# Patient Record
Sex: Female | Born: 1995 | Race: White | Hispanic: No | Smoking: Never smoker
Health system: Southern US, Community
[De-identification: ages and names within clinical notes are randomized; demographics above are authoritative.]

## PROBLEM LIST (undated history)

## (undated) DIAGNOSIS — F419 Anxiety disorder, unspecified: Secondary | ICD-10-CM

## (undated) DIAGNOSIS — D649 Anemia, unspecified: Secondary | ICD-10-CM

## (undated) DIAGNOSIS — J45909 Unspecified asthma, uncomplicated: Secondary | ICD-10-CM

## (undated) DIAGNOSIS — F909 Attention-deficit hyperactivity disorder, unspecified type: Secondary | ICD-10-CM

## (undated) DIAGNOSIS — F32A Depression, unspecified: Secondary | ICD-10-CM

## (undated) HISTORY — DX: Unspecified asthma, uncomplicated: J45.909

## (undated) HISTORY — DX: Anemia, unspecified: D64.9

## (undated) HISTORY — PX: WISDOM TOOTH EXTRACTION: SHX21

## (undated) HISTORY — DX: Attention-deficit hyperactivity disorder, unspecified type: F90.9

## (undated) HISTORY — DX: Depression, unspecified: F32.A

## (undated) HISTORY — DX: Anxiety disorder, unspecified: F41.9

## (undated) HISTORY — PX: TONSILLECTOMY: SUR1361

---
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
## 2019-12-26 LAB — HM PAP SMEAR: HM Pap smear: NEGATIVE

## 2020-01-13 ENCOUNTER — Encounter (HOSPITAL_COMMUNITY): Payer: Self-pay

## 2020-01-13 ENCOUNTER — Ambulatory Visit (HOSPITAL_COMMUNITY)
Admission: EM | Admit: 2020-01-13 | Discharge: 2020-01-13 | Disposition: A | Discharge status: Home / Self Care | Payer: Medicaid Other | Attending: Urgent Care | Admitting: Urgent Care

## 2020-01-13 DIAGNOSIS — N3000 Acute cystitis without hematuria: Secondary | ICD-10-CM

## 2020-01-13 DIAGNOSIS — Z3202 Encounter for pregnancy test, result negative: Secondary | ICD-10-CM

## 2020-01-13 DIAGNOSIS — R109 Unspecified abdominal pain: Secondary | ICD-10-CM

## 2020-01-13 DIAGNOSIS — R350 Frequency of micturition: Secondary | ICD-10-CM | POA: Diagnosis present

## 2020-01-13 DIAGNOSIS — R300 Dysuria: Secondary | ICD-10-CM | POA: Diagnosis present

## 2020-01-13 LAB — POC URINE PREG, ED: Preg Test, Ur: NEGATIVE

## 2020-01-13 MED ORDER — SULFAMETHOXAZOLE-TRIMETHOPRIM 800-160 MG PO TABS: 1.0000 | ORAL_TABLET | Freq: Two times a day (BID) | ORAL | 0 refills | Status: DC

## 2020-01-13 NOTE — ED Triage Notes (Signed)
Pt c/o 7/10 sharp left flak pain, urinary frequency, urinary urgency, and nauseax2 days.

## 2020-01-13 NOTE — Discharge Instructions (Signed)
Make sure you hydrate very well with plain water and a quantity of 64 ounces of water a day.  Please limit drinks that are considered urinary irritants such as soda, sweet tea, coffee, energy drinks, alcohol.  These can worsen your UTI symptoms and also be the source of them.  I will let you know about your urine culture results through MyChart to see if we need to change your antibiotics based off of those results. ° °

## 2020-01-13 NOTE — ED Provider Notes (Signed)
[facility]   MRN: [id number] DOB: 30-Oct-1995  Subjective:   Margaret Reed is a 24 y.o. female presenting for 1 week history of persistent dysuria, urinary frequency, urinary urgency, left flank pain.  Patient was seen at a different clinic, diagnosed with an UTI and prescribed with the patient thinks is ciprofloxacin.  She finished the course and does not know her results but still has the symptoms.  Admits that she does not drink water at all.  She primarily hydrates with sodas only.  Denies fevers, nausea, vomiting, abdominal or pelvic pain, hematuria.  No current facility-administered medications for this encounter.  Current Outpatient Medications:    busPIRone (BUSPAR) 15 MG tablet, Take 15 mg by mouth 2 (two) times daily., Disp: , Rfl:    cloNIDine (CATAPRES) 0.2 MG tablet, Take 0.2 mg by mouth at bedtime., Disp: , Rfl:    escitalopram (LEXAPRO) 20 MG tablet, Take 20 mg by mouth daily., Disp: , Rfl:    hydrOXYzine (VISTARIL) 25 MG capsule, Take 25 mg by mouth 3 (three) times daily as needed for anxiety., Disp: , Rfl:    norethindrone-ethinyl estradiol (LOESTRIN) 1-20 MG-MCG tablet, Take 1 tablet by mouth daily., Disp: , Rfl:    No Known Allergies  History reviewed. No pertinent past medical history.   Past Surgical History:  Procedure Laterality Date   TONSILLECTOMY     WISDOM TOOTH EXTRACTION      No family history on file.  Social History   Tobacco Use   Smoking status: Never Smoker   Smokeless tobacco: Never Used  Substance Use Topics   Alcohol use: Never   Drug use: Never    ROS   Objective:   Vitals: BP 126/82    Pulse 93    Temp 99.4 F (37.4 C) (Oral)    Resp 16    Ht 5\' 4"  (1.626 m)    Wt 277 lb (125.6 kg)    SpO2 97%    BMI 47.55 kg/m   Physical Exam Constitutional:      General: She is not in acute distress.    Appearance: Normal appearance. She is well-developed. She is not ill-appearing, toxic-appearing or diaphoretic.  HENT:      Head: Normocephalic and atraumatic.     Nose: Nose normal.     Mouth/Throat:     Mouth: Mucous membranes are moist.     Pharynx: Oropharynx is clear.  Eyes:     General: No scleral icterus.       Right eye: No discharge.        Left eye: No discharge.     Extraocular Movements: Extraocular movements intact.     Conjunctiva/sclera: Conjunctivae normal.     Pupils: Pupils are equal, round, and reactive to light.  Cardiovascular:     Rate and Rhythm: Normal rate.  Pulmonary:     Effort: Pulmonary effort is normal.  Abdominal:     General: Bowel sounds are normal. There is no distension.     Palpations: Abdomen is soft. There is no mass.     Tenderness: There is no abdominal tenderness. There is no right CVA tenderness, left CVA tenderness, guarding or rebound.  Skin:    General: Skin is warm and dry.  Neurological:     General: No focal deficit present.     Mental Status: She is alert and oriented to person, place, and time.  Psychiatric:        Mood and Affect: Mood normal.  Behavior: Behavior normal.        Thought Content: Thought content normal.        Judgment: Judgment normal.     Results for orders placed or performed during the hospital encounter of 01/13/20 (from the past 24 hour(s))  POC urine pregnancy     Status: None   Collection Time: 01/13/20  2:12 PM  Result Value Ref Range   Preg Test, Ur NEGATIVE NEGATIVE   Urinalysis did not crossover, was positive for nitrite, large leukocytes, specific gravity of 1.000.  Assessment and Plan :   PDMP not reviewed this encounter.  1. Acute cystitis without hematuria   2. Dysuria   3. Urinary frequency   4. Left flank pain     Start Bactrim, urine culture pending.  Advised patient to sign up for my chart so we could communicate results.  Recommended cutting out all sodas, starting to drink just plain water. Counseled patient on potential for adverse effects with medications prescribed/recommended today, ER  and return-to-clinic precautions discussed, patient verbalized understanding.    Wallis Bamberg, PA-C 01/13/20 1422

## 2020-01-13 NOTE — ED Notes (Signed)
Urinalysis testing exceeded the limitations of the [employer] capabilities. Notified provider. Urine culture ordered 

## 2020-01-15 LAB — URINE CULTURE: Culture: >=100000 — AB

## 2020-01-16 ENCOUNTER — Encounter (HOSPITAL_COMMUNITY): Payer: Self-pay

## 2020-01-16 ENCOUNTER — Other Ambulatory Visit: Payer: Self-pay

## 2020-01-16 DIAGNOSIS — N3000 Acute cystitis without hematuria: Secondary | ICD-10-CM | POA: Diagnosis not present

## 2020-01-16 DIAGNOSIS — N390 Urinary tract infection, site not specified: Secondary | ICD-10-CM | POA: Diagnosis present

## 2020-01-16 DIAGNOSIS — B9689 Other specified bacterial agents as the cause of diseases classified elsewhere: Secondary | ICD-10-CM | POA: Diagnosis not present

## 2020-01-16 LAB — CBC
HCT: 40.5 % (ref 36.0–46.0)
Hemoglobin: 12.8 g/dL (ref 12.0–15.0)
MCH: 26.3 pg (ref 26.0–34.0)
MCHC: 31.6 g/dL (ref 30.0–36.0)
MCV: 83.2 fL (ref 80.0–100.0)
Platelets: 386 10*3/uL (ref 150–400)
RBC: 4.87 MIL/uL (ref 3.87–5.11)
RDW: 14.8 % (ref 11.5–15.5)
WBC: 10.1 10*3/uL (ref 4.0–10.5)
nRBC: 0 % (ref 0.0–0.2)

## 2020-01-16 NOTE — ED Triage Notes (Signed)
Patient arrived stating since Friday she has been having sharp left side pain. Diagnosed with UTI from urgent care. Reports urinary symptoms have resolved but still having the flank pain and told to come to the ED for a scan.

## 2020-01-17 ENCOUNTER — Emergency Department (HOSPITAL_COMMUNITY)
Admission: EM | Admit: 2020-01-17 | Discharge: 2020-01-17 | Disposition: A | Discharge status: Home / Self Care | Payer: Medicaid Other | Attending: Emergency Medicine | Admitting: Emergency Medicine

## 2020-01-17 ENCOUNTER — Emergency Department (HOSPITAL_COMMUNITY): Payer: Medicaid Other

## 2020-01-17 ENCOUNTER — Encounter (HOSPITAL_COMMUNITY): Payer: Self-pay | Admitting: Emergency Medicine

## 2020-01-17 DIAGNOSIS — N3000 Acute cystitis without hematuria: Secondary | ICD-10-CM

## 2020-01-17 LAB — BASIC METABOLIC PANEL
Anion gap: 8 (ref 5–15)
BUN: 18 mg/dL (ref 6–20)
CO2: 26 mmol/L (ref 22–32)
Calcium: 9.6 mg/dL (ref 8.9–10.3)
Chloride: 105 mmol/L (ref 98–111)
Creatinine, Ser: 1.12 mg/dL — ABNORMAL HIGH (ref 0.44–1.00)
GFR calc Af Amer: >60 mL/min (ref >60)
GFR calc non Af Amer: >60 mL/min (ref >60)
Glucose, Bld: 103 mg/dL — ABNORMAL HIGH (ref 70–99)
Potassium: 5 mmol/L (ref 3.5–5.1)
Sodium: 139 mmol/L (ref 135–145)

## 2020-01-17 LAB — URINALYSIS, ROUTINE W REFLEX MICROSCOPIC
Bilirubin Urine: NEGATIVE
Glucose, UA: NEGATIVE mg/dL
Ketones, ur: NEGATIVE mg/dL
Leukocytes,Ua: NEGATIVE
Nitrite: NEGATIVE
Protein, ur: NEGATIVE mg/dL
Specific Gravity, Urine: 1.011 (ref 1.005–1.030)
pH: 5 (ref 5.0–8.0)

## 2020-01-17 LAB — I-STAT BETA HCG BLOOD, ED (MC, WL, AP ONLY): I-stat hCG, quantitative: <5 m[IU]/mL (ref <5)

## 2020-01-17 MED ORDER — IBUPROFEN 800 MG PO TABS
800.0000 mg | ORAL_TABLET | Freq: Once | ORAL | Status: AC
  Administered 2020-01-17: 800 mg via ORAL
  Filled 2020-01-17: qty 1

## 2020-01-17 MED ORDER — CEPHALEXIN 500 MG PO CAPS: ORAL_CAPSULE | ORAL | 0 refills | Status: DC

## 2020-01-17 NOTE — ED Provider Notes (Signed)
[facility]EMERGENCY DEPT Provider Note   CSN: [id number] Arrival date & time: 01/16/20  2315     History No chief complaint on file.   Margaret Reed is a 24 y.o. female.  The history is provided by the patient.  Flank Pain This is a new problem. The current episode started more than 1 week ago. The problem occurs constantly. The problem has not changed since onset.Pertinent negatives include no chest pain, no abdominal pain, no headaches and no shortness of breath. Nothing aggravates the symptoms. Nothing relieves the symptoms. Treatments tried: bactrim  The treatment provided no relief.  Patient is being treated for UTI by urgent care and diagnosed with UTI.  Still having L low back pain.  No f/c/r. No n/v/d.  No hematuria.  Was told by urgent care to be seen for CT scan.       History reviewed. No pertinent past medical history.  There are no problems to display for this patient.   Past Surgical History:  Procedure Laterality Date  . TONSILLECTOMY    . WISDOM TOOTH EXTRACTION       OB History   No obstetric history on file.     History reviewed. No pertinent family history.  Social History   Tobacco Use  . Smoking status: Never Smoker  . Smokeless tobacco: Never Used  Substance Use Topics  . Alcohol use: Never  . Drug use: Never    Home Medications Prior to Admission medications   Medication Sig Start Date End Date Taking? Authorizing Provider  acetaminophen (TYLENOL) 325 MG tablet Take 650 mg by mouth every 6 (six) hours as needed for mild pain, moderate pain, fever or headache.   Yes [provider]  norethindrone-ethinyl estradiol (LOESTRIN) 1-20 MG-MCG tablet Take 1 tablet by mouth daily. 12/26/19  Yes [provider]  sulfamethoxazole-trimethoprim (BACTRIM DS) 800-160 MG tablet Take 1 tablet by mouth 2 (two) times daily. 01/13/20  Yes Wallis Bamberg, PA-C    Allergies    Patient has no known allergies.  Review of  Systems   Review of Systems  Constitutional: Negative for unexpected weight change.  HENT: Negative for congestion.   Eyes: Negative for visual disturbance.  Respiratory: Negative for shortness of breath.   Cardiovascular: Negative for chest pain.  Gastrointestinal: Negative for abdominal pain.  Genitourinary: Positive for dysuria and flank pain.  Musculoskeletal: Negative for arthralgias.  Skin: Negative for color change.  Neurological: Negative for headaches.  Psychiatric/Behavioral: Negative for agitation.  All other systems reviewed and are negative.   Physical Exam Updated Vital Signs BP (!) 143/81 (BP Location: Left Arm)   Pulse 73   Temp 97.9 F (36.6 C) (Oral)   Resp 17   Ht 5\' 4"  (1.626 m)   Wt 125.2 kg   LMP 01/16/2020 Comment: negative beta HCG 01/16/20  SpO2 96%   BMI 47.38 kg/m   Physical Exam Vitals and nursing note reviewed.  Constitutional:      General: She is not in acute distress.    Appearance: Normal appearance.  HENT:     Head: Normocephalic and atraumatic.     Nose: Nose normal.  Eyes:     Conjunctiva/sclera: Conjunctivae normal.     Pupils: Pupils are equal, round, and reactive to light.  Cardiovascular:     Rate and Rhythm: Normal rate and regular rhythm.     Pulses: Normal pulses.     Heart sounds: Normal heart sounds.  Pulmonary:     Effort:  Pulmonary effort is normal.     Breath sounds: Normal breath sounds.  Abdominal:     General: Abdomen is flat. Bowel sounds are normal.     Palpations: Abdomen is soft.     Tenderness: There is no abdominal tenderness. There is no guarding or rebound.  Musculoskeletal:        General: Normal range of motion.     Cervical back: Normal range of motion and neck supple.  Skin:    General: Skin is warm and dry.     Capillary Refill: Capillary refill takes less than 2 seconds.  Neurological:     General: No focal deficit present.     Mental Status: She is alert and oriented to person, place, and time.   Psychiatric:        Mood and Affect: Mood normal.        Behavior: Behavior normal.     ED Results / Procedures / Treatments   Labs (all labs ordered are listed, but only abnormal results are displayed) Results for orders placed or performed during the hospital encounter of 01/17/20  CBC  Result Value Ref Range   WBC 10.1 4.0 - 10.5 K/uL   RBC 4.87 3.87 - 5.11 MIL/uL   Hemoglobin 12.8 12.0 - 15.0 g/dL   HCT 88.8 36 - 46 %   MCV 83.2 80.0 - 100.0 fL   MCH 26.3 26.0 - 34.0 pg   MCHC 31.6 30.0 - 36.0 g/dL   RDW 28.0 03.4 - 91.7 %   Platelets 386 150 - 400 K/uL   nRBC 0.0 0.0 - 0.2 %  Basic metabolic panel  Result Value Ref Range   Sodium 139 135 - 145 mmol/L   Potassium 5.0 3.5 - 5.1 mmol/L   Chloride 105 98 - 111 mmol/L   CO2 26 22 - 32 mmol/L   Glucose, Bld 103 (H) 70 - 99 mg/dL   BUN 18 6 - 20 mg/dL   Creatinine, Ser 9.15 (H) 0.44 - 1.00 mg/dL   Calcium 9.6 8.9 - 05.6 mg/dL   GFR calc non Af Amer >60 >60 mL/min   GFR calc Af Amer >60 >60 mL/min   Anion gap 8 5 - 15  I-Stat beta hCG blood, ED  Result Value Ref Range   I-stat hCG, quantitative <5.0 <5 mIU/mL   Comment 3           CT Renal Stone Study  Result Date: 01/17/2020 CLINICAL DATA:  Left flank pain for 5 days EXAM: CT ABDOMEN AND PELVIS WITHOUT CONTRAST TECHNIQUE: Multidetector CT imaging of the abdomen and pelvis was performed following the standard protocol without IV contrast. COMPARISON:  None. FINDINGS: Lower chest: Unremarkable Hepatobiliary: Unremarkable Pancreas: Unremarkable Spleen: Unremarkable Adrenals/Urinary Tract: Unremarkable Stomach/Bowel: Unremarkable.  Appendix normal. Vascular/Lymphatic: Unremarkable Reproductive: Unremarkable Other: No supplemental non-categorized findings. Musculoskeletal: Unremarkable IMPRESSION: A cause for the patient's left flank pain is not identified. Electronically Signed   By: Gaylyn Rong M.D.   On: 01/17/2020 06:02    Radiology CT Renal Stone Study  Result  Date: 01/17/2020 CLINICAL DATA:  Left flank pain for 5 days EXAM: CT ABDOMEN AND PELVIS WITHOUT CONTRAST TECHNIQUE: Multidetector CT imaging of the abdomen and pelvis was performed following the standard protocol without IV contrast. COMPARISON:  None. FINDINGS: Lower chest: Unremarkable Hepatobiliary: Unremarkable Pancreas: Unremarkable Spleen: Unremarkable Adrenals/Urinary Tract: Unremarkable Stomach/Bowel: Unremarkable.  Appendix normal. Vascular/Lymphatic: Unremarkable Reproductive: Unremarkable Other: No supplemental non-categorized findings. Musculoskeletal: Unremarkable IMPRESSION: A cause for the patient's left  flank pain is not identified. Electronically Signed   By: Gaylyn Rong M.D.   On: 01/17/2020 06:02    Procedures Procedures (including critical care time)  Medications Ordered in ED Medications  ibuprofen (ADVIL) tablet 800 mg (800 mg Oral Given 01/17/20 0458)    ED Course  I have reviewed the triage vital signs and the nursing notes.  Pertinent labs & imaging results that were available during my care of the patient were reviewed by me and considered in my medical decision making (see chart for details).   Will change antibiotics to keflex as culture reveals it is slightly better.  No stones.  Start ibuprofen.     Margaret Reed was evaluated in Emergency Department on 01/17/2020 for the symptoms described in the history of present illness. She was evaluated in the context of the global COVID-19 pandemic, which necessitated consideration that the patient might be at risk for infection with the SARS-CoV-2 virus that causes COVID-19. Institutional protocols and algorithms that pertain to the evaluation of patients at risk for COVID-19 are in a state of rapid change based on information released by regulatory bodies including the CDC and federal and state organizations. These policies and algorithms were followed during the patient's care in the ED.  Final Clinical Impression(s) / ED  Diagnoses  Return for intractable cough, coughing up blood,fevers >100.4 unrelieved by medication, shortness of breath, intractable vomiting, chest pain, shortness of breath, weakness,numbness, changes in speech, facial asymmetry,abdominal pain, passing out,Inability to tolerate liquids or food, cough, altered mental status or any concerns. No signs of systemic illness or infection. The patient is nontoxic-appearing on exam and vital signs are within normal limits.   I have reviewed the triage vital signs and the nursing notes. Pertinent labs &imaging results that were available during my care of the patient were reviewed by me and considered in my medical decision making (see chart for details).After history, exam, and medical workup I feel the patient has beenappropriately medically screened and is safe for discharge home. Pertinent diagnoses were discussed with the patient. Patient was given return precautions.    Malaijah Houchen, MD 01/17/20 [phone]

## 2020-07-20 DIAGNOSIS — F32A Depression, unspecified: Secondary | ICD-10-CM | POA: Insufficient documentation

## 2020-07-20 DIAGNOSIS — F419 Anxiety disorder, unspecified: Secondary | ICD-10-CM | POA: Insufficient documentation

## 2020-09-25 ENCOUNTER — Other Ambulatory Visit: Payer: Self-pay

## 2020-09-25 ENCOUNTER — Ambulatory Visit: Payer: BLUE CROSS/BLUE SHIELD | Admitting: Family

## 2020-10-16 ENCOUNTER — Ambulatory Visit: Payer: Self-pay | Admitting: Family Medicine

## 2020-10-22 DIAGNOSIS — J0110 Acute frontal sinusitis, unspecified: Secondary | ICD-10-CM | POA: Insufficient documentation

## 2020-11-14 ENCOUNTER — Encounter: Payer: Self-pay | Admitting: Family Medicine

## 2020-11-14 ENCOUNTER — Ambulatory Visit: Payer: BLUE CROSS/BLUE SHIELD | Admitting: Family Medicine

## 2020-11-14 ENCOUNTER — Other Ambulatory Visit: Payer: Self-pay

## 2020-11-14 VITALS — BP 120/78 | HR 72 | Ht 66.0 in | Wt 276.0 lb

## 2020-11-14 DIAGNOSIS — J4520 Mild intermittent asthma, uncomplicated: Secondary | ICD-10-CM | POA: Diagnosis not present

## 2020-11-14 DIAGNOSIS — R45851 Suicidal ideations: Secondary | ICD-10-CM

## 2020-11-14 DIAGNOSIS — F902 Attention-deficit hyperactivity disorder, combined type: Secondary | ICD-10-CM

## 2020-11-14 DIAGNOSIS — Z9151 Personal history of suicidal behavior: Secondary | ICD-10-CM

## 2020-11-14 DIAGNOSIS — F32A Depression, unspecified: Secondary | ICD-10-CM

## 2020-11-14 DIAGNOSIS — G4730 Sleep apnea, unspecified: Secondary | ICD-10-CM

## 2020-11-14 DIAGNOSIS — F419 Anxiety disorder, unspecified: Secondary | ICD-10-CM

## 2020-11-14 DIAGNOSIS — L309 Dermatitis, unspecified: Secondary | ICD-10-CM

## 2020-11-14 MED ORDER — TRIAMCINOLONE ACETONIDE 0.1 % EX CREA: 1.0000 "application " | TOPICAL_CREAM | Freq: Two times a day (BID) | CUTANEOUS | 0 refills | Status: DC

## 2020-11-14 MED ORDER — QVAR REDIHALER 40 MCG/ACT IN AERB: 1.0000 | INHALATION_SPRAY | Freq: Two times a day (BID) | RESPIRATORY_TRACT | 2 refills | Status: DC

## 2020-11-14 NOTE — Patient Instructions (Signed)
You can call to schedule your appointment with the psychiatrist/counselor. A few offices are listed below for you to call.    [facility]  Ask for a psychiatrist  [address]  (across from [facility])  [phone]      [facility] [address] [place], [place] [address] [phone]   [facility]  [address], [place], [place] [address]  Phone: [phone]   [facility] [address] [place], [place] [address]  Phone: [phone]  You can call to schedule your appointment with the counselor. A few offices are listed below for you to call.    [facility] [address] [place], [place] [address]  Phone: [phone]  [facility]  [address], [place], [place] [address]  Phone: [phone]  [facility] [address] [place], [place] [address] [phone] for appointments   [facility]  [address] [place], [place] [address] [phone]  [facility]  [address]  (across from [facility])  [phone]   [facility] Resident only [address] [place], [place] [address] [phone]    [facility] [address] [place], [place] [address] [phone]
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
## 2020-11-15 LAB — CBC WITH DIFFERENTIAL/PLATELET
Basophils Absolute: 0.1 10*3/uL (ref 0.0–0.2)
Basos: 1 %
EOS (ABSOLUTE): 0.2 10*3/uL (ref 0.0–0.4)
Eos: 2 %
Hematocrit: 37.9 % (ref 34.0–46.6)
Hemoglobin: 12.3 g/dL (ref 11.1–15.9)
Immature Grans (Abs): 0 10*3/uL (ref 0.0–0.1)
Immature Granulocytes: 0 %
Lymphocytes Absolute: 3.1 10*3/uL (ref 0.7–3.1)
Lymphs: 37 %
MCH: 26.1 pg — ABNORMAL LOW (ref 26.6–33.0)
MCHC: 32.5 g/dL (ref 31.5–35.7)
MCV: 80 fL (ref 79–97)
Monocytes Absolute: 0.6 10*3/uL (ref 0.1–0.9)
Monocytes: 7 %
Neutrophils Absolute: 4.4 10*3/uL (ref 1.4–7.0)
Neutrophils: 53 %
Platelets: 314 10*3/uL (ref 150–450)
RBC: 4.72 x10E6/uL (ref 3.77–5.28)
RDW: 14.4 % (ref 11.7–15.4)
WBC: 8.4 10*3/uL (ref 3.4–10.8)

## 2020-11-15 LAB — COMPREHENSIVE METABOLIC PANEL
ALT: 21 IU/L (ref 0–32)
AST: 20 IU/L (ref 0–40)
Albumin/Globulin Ratio: 2 (ref 1.2–2.2)
Albumin: 4.4 g/dL (ref 3.9–5.0)
Alkaline Phosphatase: 60 IU/L (ref 44–121)
BUN/Creatinine Ratio: 11 (ref 9–23)
BUN: 10 mg/dL (ref 6–20)
Bilirubin Total: 0.6 mg/dL (ref 0.0–1.2)
CO2: 20 mmol/L (ref 20–29)
Calcium: 9.5 mg/dL (ref 8.7–10.2)
Chloride: 101 mmol/L (ref 96–106)
Creatinine, Ser: 0.91 mg/dL (ref 0.57–1.00)
Globulin, Total: 2.2 g/dL (ref 1.5–4.5)
Glucose: 77 mg/dL (ref 65–99)
Potassium: 4.4 mmol/L (ref 3.5–5.2)
Sodium: 141 mmol/L (ref 134–144)
Total Protein: 6.6 g/dL (ref 6.0–8.5)
eGFR: 91 mL/min/{1.73_m2} (ref >59)

## 2020-11-15 LAB — T3: T3, Total: 166 ng/dL (ref 71–180)

## 2020-11-15 LAB — TSH: TSH: 9.53 u[IU]/mL — ABNORMAL HIGH (ref 0.450–4.500)

## 2020-11-15 LAB — T4, FREE: Free T4: 0.95 ng/dL (ref 0.82–1.77)

## 2020-11-15 NOTE — Progress Notes (Signed)
Her TSH (thyroid function) is elevated but her other thyroid tests are normal. I recommend repeating her TSH in 4 weeks. This should be an office visit so we can discuss this further. Otherwise her labs are fine.

## 2020-12-04 DIAGNOSIS — J45909 Unspecified asthma, uncomplicated: Secondary | ICD-10-CM | POA: Insufficient documentation

## 2020-12-06 IMAGING — CT CT RENAL STONE PROTOCOL
2 of 4 series · 17 of 46 positions shown, 19 images · non-contrast
Comparison: None.

CLINICAL DATA: Left flank pain for 5 days

EXAM:
CT ABDOMEN AND PELVIS WITHOUT CONTRAST
TECHNIQUE: Multidetector CT imaging of the abdomen and pelvis was performed
following the standard protocol without IV contrast.

[Series 2: axial st · axial · 0.72mm/px · z∈[-281,+139]mm · 14 of 96 slices shown, 16 images]
[im 6/96  soft-tissue]
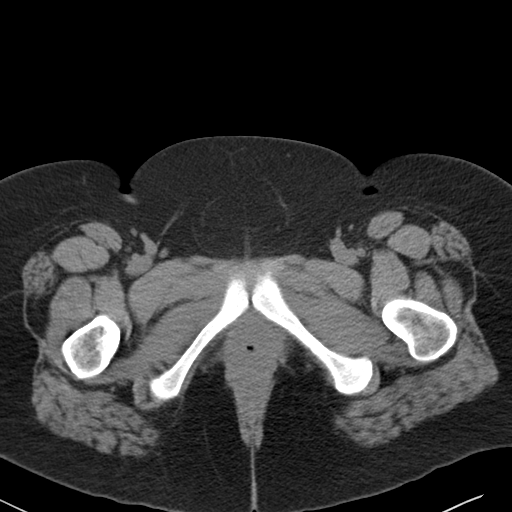
[im 6/96  bone]
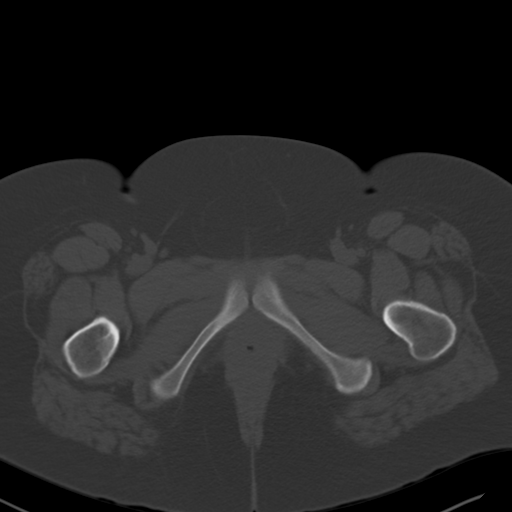
[im 11/96  soft-tissue]
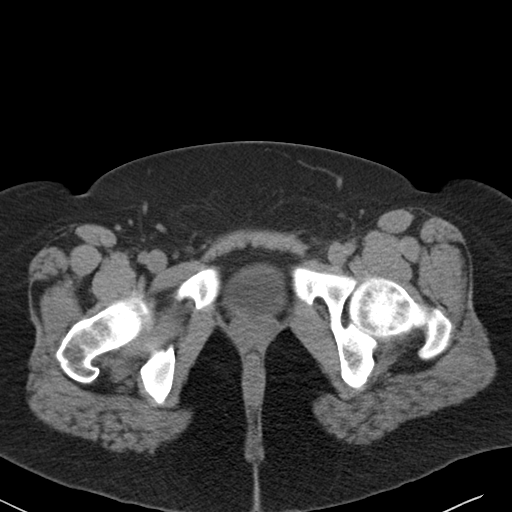
[im 22/96  soft-tissue]
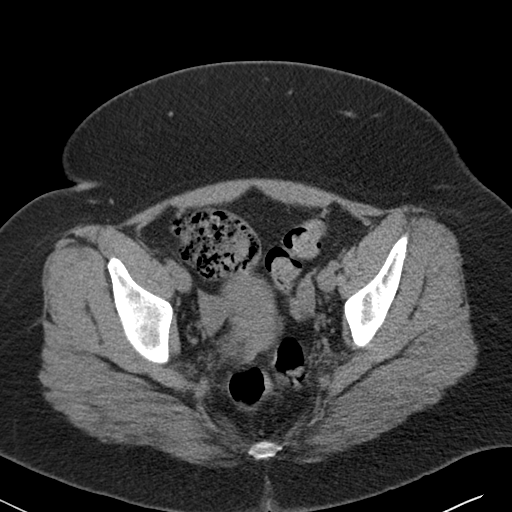
[im 27/96  soft-tissue]
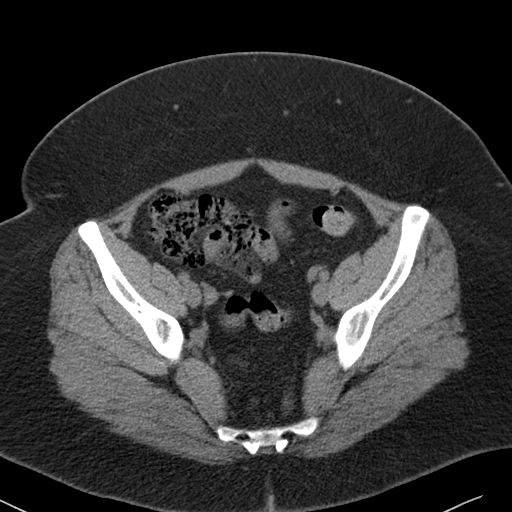
[im 32/96  soft-tissue]
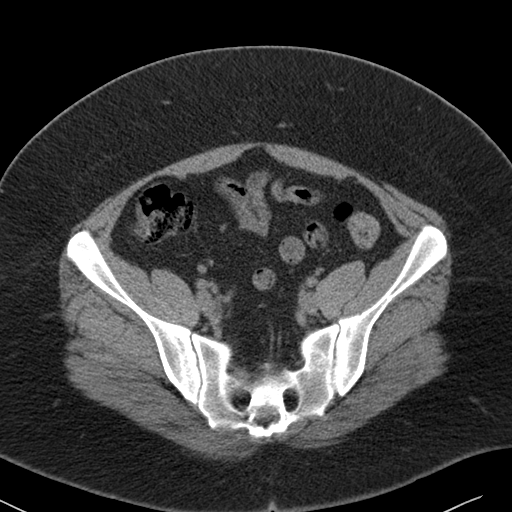
[im 37/96  soft-tissue]
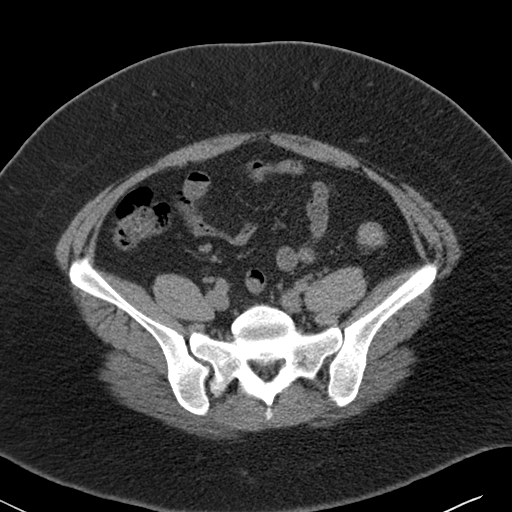
[im 43/96  soft-tissue]
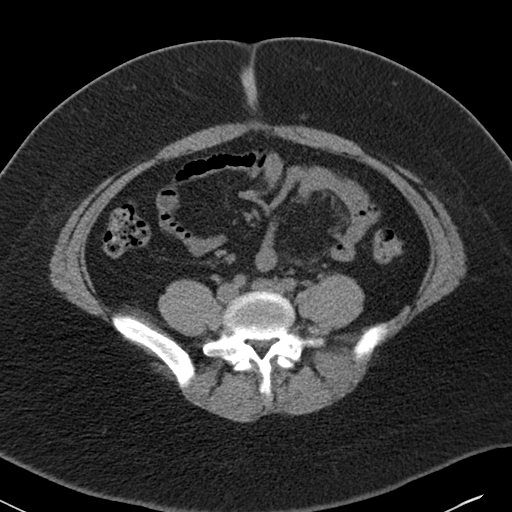
[im 53/96  soft-tissue]
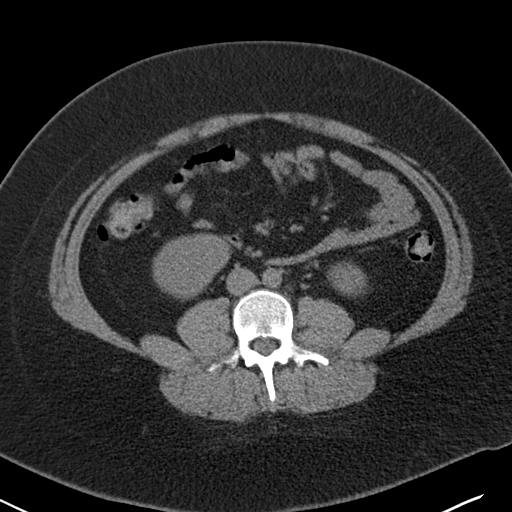
[im 59/96  soft-tissue]
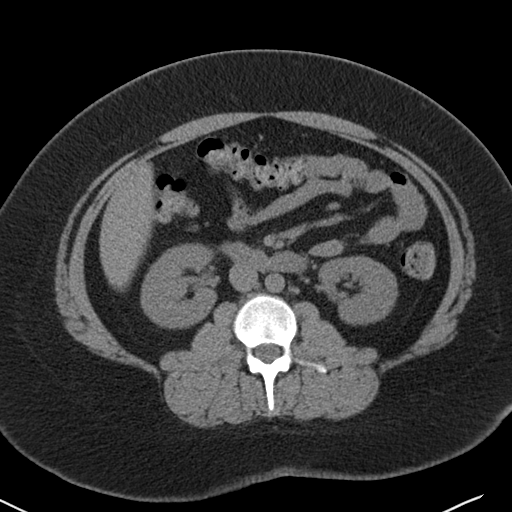
[im 59/96  bone]
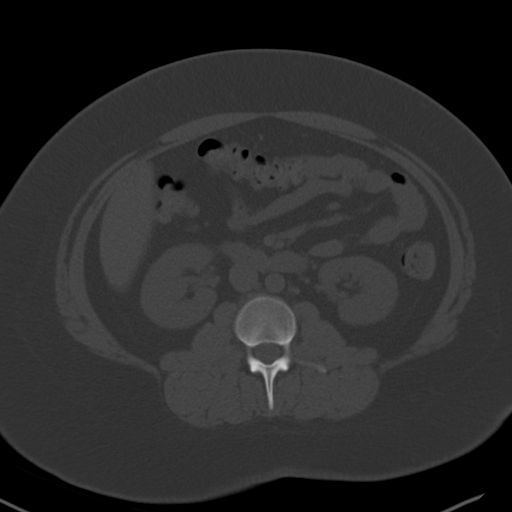
[im 64/96  soft-tissue]
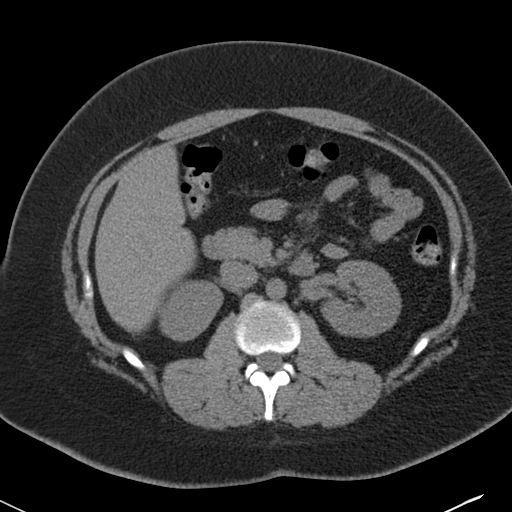
[im 69/96  soft-tissue]
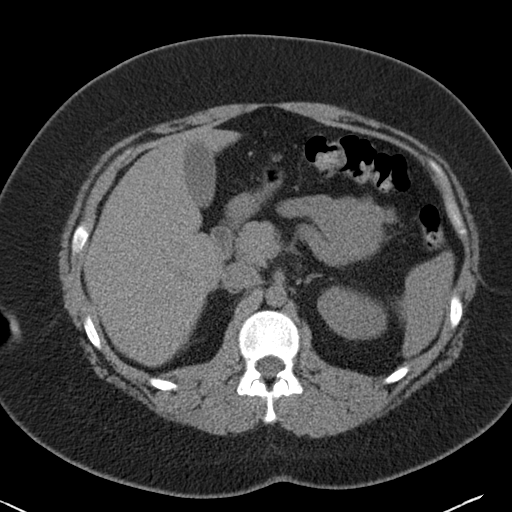
[im 74/96  soft-tissue]
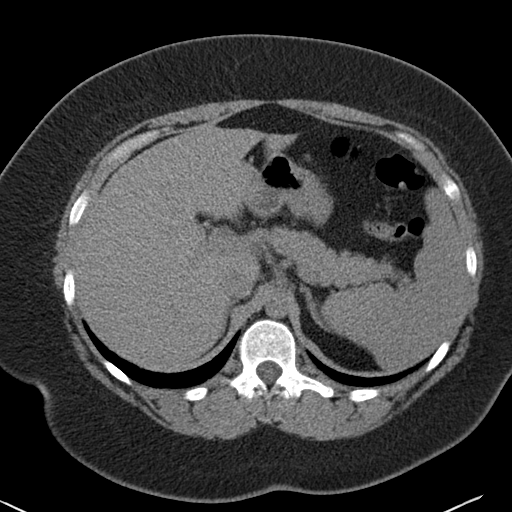
[im 85/96  soft-tissue]
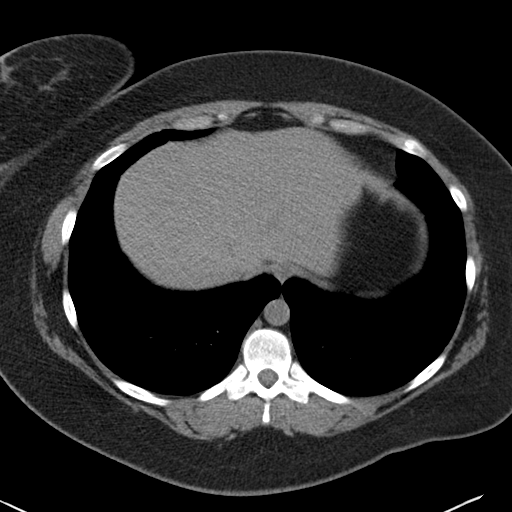
[im 90/96  soft-tissue]
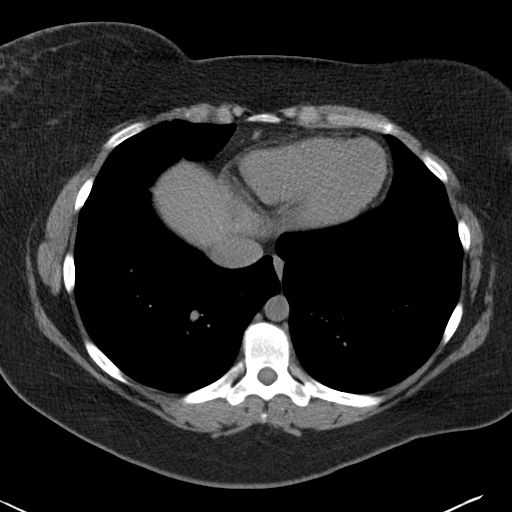

[Series 4: coronal · coronal · 0.81mm/px · 3 of 161 slices shown]
[im 54/161  soft-tissue]
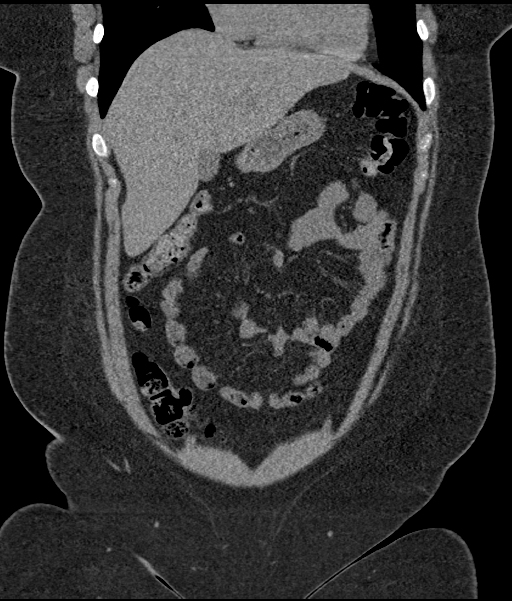
[im 72/161  soft-tissue]
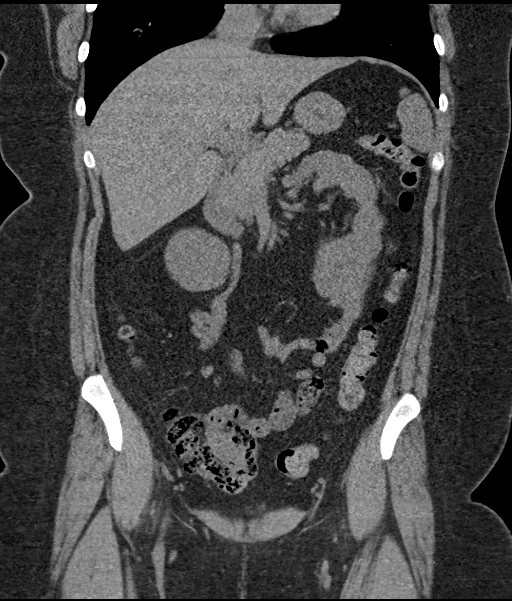
[im 89/161  soft-tissue]
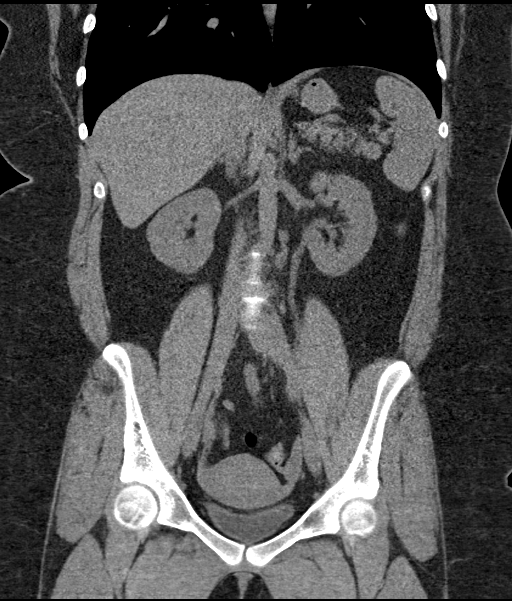

[17 of 46 positions shown; findings below may reference images not displayed]

FINDINGS: Lower chest: Unremarkable

Hepatobiliary: Unremarkable

Pancreas: Unremarkable

Spleen: Unremarkable

Adrenals/Urinary Tract: Unremarkable

Stomach/Bowel: Unremarkable.  Appendix normal.

Vascular/Lymphatic: Unremarkable

Reproductive: Unremarkable

Other: No supplemental non-categorized findings.

Musculoskeletal: Unremarkable
IMPRESSION: A cause for the patient's left flank pain is not identified.

## 2020-12-31 ENCOUNTER — Ambulatory Visit: Payer: BLUE CROSS/BLUE SHIELD | Admitting: Family Medicine

## 2020-12-31 ENCOUNTER — Encounter: Payer: Self-pay | Admitting: Family Medicine

## 2020-12-31 ENCOUNTER — Other Ambulatory Visit: Payer: Self-pay

## 2020-12-31 VITALS — BP 100/72 | HR 84 | Temp 96.5°F | Ht 66.0 in | Wt 272.4 lb

## 2020-12-31 DIAGNOSIS — F32A Depression, unspecified: Secondary | ICD-10-CM

## 2020-12-31 DIAGNOSIS — F419 Anxiety disorder, unspecified: Secondary | ICD-10-CM | POA: Diagnosis not present

## 2020-12-31 DIAGNOSIS — R7989 Other specified abnormal findings of blood chemistry: Secondary | ICD-10-CM

## 2020-12-31 DIAGNOSIS — G4730 Sleep apnea, unspecified: Secondary | ICD-10-CM

## 2020-12-31 DIAGNOSIS — R400 Somnolence: Secondary | ICD-10-CM

## 2020-12-31 NOTE — Progress Notes (Signed)
   Subjective:    Patient ID: Margaret Reed, female    DOB: [birth year], 25 y.o.   MRN: [id number]  HPI Chief Complaint  Patient presents with   Follow-up    TSH no med    She is here to follow up on elevated TSH with normal free T4 and T3.   States she has been having hot flashes for a few years and they have been worse in the past year.  Reports feeling sluggish Denies fever, chills, headache, dizziness, chest pain, palpitations, shortness of breath, abdominal pain, nausea, vomiting or diarrhea.  No skin hair or nail changes.  Denies constipation   States she has an appt with psychiatrist on 01/29/2021. Her mood is unchanged. Denies SI.    LMP: earlier this month and regular periods.   Sleep apnea and states her CPAP did and help with her symptoms. Waking up with headaches and thinks it may be related to untreated OSA. Sleepy during the day.   States she feels like she has to urinate frequently. Ongoing since childhood. She saw a urologist in the past year for it.     Review of Systems Pertinent positives and negatives in the history of present illness.     Objective:   Physical Exam BP 100/72   Pulse 84   Temp (!) 96.5 F (35.8 C)   Ht 5\' 6"  (1.676 m)   Wt 272 lb 6.4 oz (123.6 kg)   SpO2 97%   BMI 43.97 kg/m   And in no acute distress.  Respirations unlabored.  Normal speech, mood and thought process.  Denies SI      Assessment & Plan:  Elevated TSH - Plan: TSH, T4, free, T3  Anxiety and depression  Sleep apnea, unspecified type - Plan: Home sleep test  Daytime sleepiness - Plan: Home sleep test  Morbid obesity (HCC) - Plan: Home sleep test  Here today to follow-up on abnormal lab values.  Discussed starting her on levothyroxine if her TSH is still elevated.  This may help with her mood and sluggishness.  I counseled her on how to properly take the medication and that she would need a 4-week follow-up. Discussed that her fatigue may also be related to  untreated sleep apnea.  She does not have a machine at home.  She will need a new sleep study.  I will order this and she is amenable Congratulated her on scheduling with a psychiatrist.  Her mood seems to be improved and no longer suicidal

## 2021-01-01 ENCOUNTER — Telehealth: Payer: Self-pay

## 2021-01-01 ENCOUNTER — Encounter: Payer: Self-pay | Admitting: Family Medicine

## 2021-01-01 LAB — T3: T3, Total: 123 ng/dL (ref 71–180)

## 2021-01-01 LAB — T4, FREE: Free T4: 0.94 ng/dL (ref 0.82–1.77)

## 2021-01-01 LAB — TSH: TSH: 3.03 u[IU]/mL (ref 0.450–4.500)

## 2021-01-01 NOTE — Telephone Encounter (Signed)
Lvmfor pt to call back to advised how she is doing. There were no admissions to [facility] and will advise Vickie Np. [facility]

## 2021-01-01 NOTE — Telephone Encounter (Signed)
Called pt agin to check on her no answer. [facility]

## 2021-01-01 NOTE — Progress Notes (Signed)
Did you get in touch with her regarding her suicidal thoughts?

## 2021-01-21 ENCOUNTER — Ambulatory Visit: Payer: BLUE CROSS/BLUE SHIELD | Admitting: Internal Medicine

## 2021-01-23 ENCOUNTER — Encounter: Payer: Self-pay | Admitting: Family Medicine

## 2021-02-13 ENCOUNTER — Ambulatory Visit: Payer: BLUE CROSS/BLUE SHIELD | Admitting: Family Medicine

## 2021-03-12 ENCOUNTER — Other Ambulatory Visit: Payer: Self-pay

## 2021-03-12 ENCOUNTER — Ambulatory Visit (HOSPITAL_BASED_OUTPATIENT_CLINIC_OR_DEPARTMENT_OTHER): Payer: BLUE CROSS/BLUE SHIELD | Attending: Family Medicine | Admitting: Internal Medicine

## 2021-03-12 VITALS — Ht 65.0 in | Wt 274.0 lb

## 2021-03-12 DIAGNOSIS — R400 Somnolence: Secondary | ICD-10-CM | POA: Diagnosis not present

## 2021-03-12 DIAGNOSIS — G4730 Sleep apnea, unspecified: Secondary | ICD-10-CM | POA: Insufficient documentation

## 2021-03-12 DIAGNOSIS — Z6841 Body Mass Index (BMI) 40.0 and over, adult: Secondary | ICD-10-CM | POA: Insufficient documentation

## 2021-03-12 DIAGNOSIS — R0683 Snoring: Secondary | ICD-10-CM | POA: Insufficient documentation

## 2021-03-22 DIAGNOSIS — G4730 Sleep apnea, unspecified: Secondary | ICD-10-CM

## 2021-03-22 NOTE — Procedures (Signed)
   Patient Name: Margaret Reed, Margaret Reed Date: 03/12/2021 Gender: Female D.O.B: 25-Jul-1995 Age (years): 24 Referring Provider: Avanell Shackleton NP Height (inches): 65 Interpreting Physician: Jetty Duhamel MD, ABSM Weight (lbs): 274 RPSGT: [place] BMI: 46 MRN: [id number] Neck Size: <br>  CLINICAL INFORMATION Sleep Study Type: HST Indication for sleep study: Nonrestorative sleep Epworth Sleepiness Score: 15  SLEEP STUDY TECHNIQUE A multi-channel overnight portable sleep study was performed. The channels recorded were: nasal airflow, thoracic respiratory movement, and oxygen saturation with a pulse oximetry. Snoring was also monitored.  MEDICATIONS Patient self administered medications include: N/A.  SLEEP ARCHITECTURE Patient was studied for 371.2 minutes. The sleep efficiency was 100.0 % and the patient was supine for 74.1%. The arousal index was 0.0 per hour.  RESPIRATORY PARAMETERS The overall AHI was 4.7 per hour, with a central apnea index of 0 per hour. The oxygen nadir was 83% during sleep.  CARDIAC DATA Mean heart rate during sleep was 86.2 bpm.  IMPRESSIONS - No significant obstructive sleep apnea occurred during this study (AHI = 4.7/h). At upper limit of normal range. - Moderate oxygen desaturation was noted during this study (Min O2 = 83%). Mean O2 saturation 94%. - Patient snored.  DIAGNOSIS - Normal study  RECOMMENDATIONS - Consider managing for insomnia with attention to getting sufficient sleep. - Sleep hygiene should be reviewed to assess factors that may improve sleep quality. - Weight management and regular exercise should be initiated or continued.  [Electronically signed] 03/22/2021 11:15 AM  Jetty Duhamel MD, ABSM Diplomate, American Board of Sleep Medicine   NPI: [id number]                          Jetty Duhamel Diplomate, American Board of Sleep Medicine  ELECTRONICALLY SIGNED ON:  03/22/2021, 11:12 AM [facility] PH: [phone]   FX: [phone] ACCREDITED BY THE AMERICAN ACADEMY OF SLEEP MEDICINE

## 2021-03-24 ENCOUNTER — Ambulatory Visit: Payer: BLUE CROSS/BLUE SHIELD | Admitting: Family Medicine

## 2021-03-24 ENCOUNTER — Encounter: Payer: Self-pay | Admitting: Family Medicine

## 2021-03-24 ENCOUNTER — Other Ambulatory Visit: Payer: Self-pay

## 2021-03-24 VITALS — BP 108/64 | HR 93 | Temp 97.8°F | Ht 65.0 in | Wt 284.8 lb

## 2021-03-24 DIAGNOSIS — G478 Other sleep disorders: Secondary | ICD-10-CM | POA: Diagnosis not present

## 2021-03-24 DIAGNOSIS — R5383 Other fatigue: Secondary | ICD-10-CM

## 2021-03-24 DIAGNOSIS — J3489 Other specified disorders of nose and nasal sinuses: Secondary | ICD-10-CM

## 2021-03-24 DIAGNOSIS — R519 Headache, unspecified: Secondary | ICD-10-CM

## 2021-03-24 DIAGNOSIS — R42 Dizziness and giddiness: Secondary | ICD-10-CM | POA: Diagnosis not present

## 2021-03-24 DIAGNOSIS — R400 Somnolence: Secondary | ICD-10-CM

## 2021-03-24 DIAGNOSIS — R197 Diarrhea, unspecified: Secondary | ICD-10-CM

## 2021-03-24 LAB — POC INFLUENZA A&B (BINAX/QUICKVUE)
Influenza A, POC: NEGATIVE
Influenza B, POC: NEGATIVE

## 2021-03-24 LAB — POC COVID19 BINAXNOW: SARS Coronavirus 2 Ag: NEGATIVE
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
## 2021-03-25 LAB — NOVEL CORONAVIRUS, NAA: SARS-CoV-2, NAA: NOT DETECTED

## 2021-03-25 LAB — SARS-COV-2, NAA 2 DAY TAT

## 2021-04-02 ENCOUNTER — Other Ambulatory Visit: Payer: Self-pay | Admitting: Internal Medicine

## 2021-04-02 DIAGNOSIS — R5383 Other fatigue: Secondary | ICD-10-CM

## 2021-04-02 DIAGNOSIS — G478 Other sleep disorders: Secondary | ICD-10-CM

## 2021-04-24 ENCOUNTER — Ambulatory Visit: Payer: BLUE CROSS/BLUE SHIELD | Admitting: Family Medicine

## 2021-07-31 ENCOUNTER — Institutional Professional Consult (permissible substitution): Payer: BLUE CROSS/BLUE SHIELD | Admitting: Internal Medicine

## 2021-08-08 ENCOUNTER — Other Ambulatory Visit: Payer: Self-pay

## 2021-08-08 ENCOUNTER — Encounter: Payer: Self-pay | Admitting: Family Medicine

## 2021-08-08 ENCOUNTER — Telehealth: Payer: BLUE CROSS/BLUE SHIELD | Admitting: Family Medicine

## 2021-08-08 VITALS — Ht 65.0 in | Wt 284.0 lb

## 2021-08-08 DIAGNOSIS — K529 Noninfective gastroenteritis and colitis, unspecified: Secondary | ICD-10-CM

## 2021-08-08 DIAGNOSIS — K219 Gastro-esophageal reflux disease without esophagitis: Secondary | ICD-10-CM | POA: Diagnosis not present

## 2021-08-08 NOTE — Progress Notes (Signed)
° °  Subjective:    Patient ID: Margaret Reed, female    DOB: September 18, 1995, 26 y.o.   MRN: [id number]  HPI Documentation for virtual audio and video telecommunications through Caregility encounter: The patient was located at home. 2 patient identifiers used.  The provider was located in the office. The patient did consent to this visit and is aware of possible charges through their insurance for this visit. The other persons participating in this telemedicine service were none. Time spent on call was 5 minutes and in review of previous records >15 minutes total for counseling and coordination of care. This virtual service is not related to other E/M service within previous 7 days.  She states that she has a 1 week history of diarrhea having 6-8 stools per day.  No fever, chills, abdominal pain, blood or pus in her stool or foul-smelling stool.  No other people have been sick with diarrhea.  She has been using 1 Imodium per day with minimal benefit.  She does have underlying reflux and does use Pepcid.  Review of Systems     Objective:   Physical Exam Alert and in no distress otherwise not examined       Assessment & Plan:  Gastroenteritis  Gastroesophageal reflux disease without esophagitis I explained that I thought this was a viral illness and recommended increasing the dosing of the Imodium up to at least 4 pills/day.  Explained that she can eat anything she wants as long as it does not cause any reflux type symptoms.  If her symptoms continue for another week, explained that we might need to do further testing to look for other causes of diarrhea.

## 2021-08-09 DIAGNOSIS — F909 Attention-deficit hyperactivity disorder, unspecified type: Secondary | ICD-10-CM | POA: Insufficient documentation

## 2021-08-09 DIAGNOSIS — R197 Diarrhea, unspecified: Secondary | ICD-10-CM | POA: Insufficient documentation

## 2021-08-09 DIAGNOSIS — L600 Ingrowing nail: Secondary | ICD-10-CM | POA: Insufficient documentation

## 2021-09-03 ENCOUNTER — Telehealth: Payer: Self-pay

## 2021-09-03 NOTE — Progress Notes (Unsigned)
09/05/21- 25 yoF never smoker for sleep evaluation courtesy of Hetty Blend, PA-C with concern of non-restorative sleep Medical problem ist includes  Mild Intermittent Asthma, Anxiety/Depression,  HST 03/12/21- AHI 4.7/ hr, desaturation to 83%, body weight 274 lbs Epworth score- Body weight today- Covid vax- Flu vax-

## 2021-09-03 NOTE — Telephone Encounter (Signed)
QVAR is no longer covered by pt's insurance,  P.A. requires trial and failure of Flovent HFA & Pulmicort Flexhaler.  Pt has tried Symbicort in the past.  Can pt be switched to either covered medication Flovent HFA & Pulmicort Flexhaler?

## 2021-09-05 ENCOUNTER — Institutional Professional Consult (permissible substitution): Payer: BLUE CROSS/BLUE SHIELD | Admitting: Internal Medicine

## 2021-09-05 MED ORDER — FLUTICASONE PROPIONATE HFA 110 MCG/ACT IN AERO: 1.0000 | INHALATION_SPRAY | Freq: Two times a day (BID) | RESPIRATORY_TRACT | 1 refills | Status: DC

## 2021-09-05 NOTE — Telephone Encounter (Signed)
Pt informed, she is ok with changing

## 2021-09-05 NOTE — Telephone Encounter (Signed)
done

## 2022-01-09 ENCOUNTER — Encounter: Payer: Self-pay | Admitting: Internal Medicine

## 2022-03-18 ENCOUNTER — Encounter: Payer: Self-pay | Admitting: Internal Medicine

## 2022-05-26 ENCOUNTER — Encounter: Payer: Self-pay | Admitting: Internal Medicine

## 2022-06-23 ENCOUNTER — Encounter: Payer: Self-pay | Admitting: Physician Assistant

## 2022-06-23 ENCOUNTER — Ambulatory Visit (INDEPENDENT_AMBULATORY_CARE_PROVIDER_SITE_OTHER): Payer: BLUE CROSS/BLUE SHIELD | Admitting: Physician Assistant

## 2022-06-23 VITALS — BP 132/86 | HR 82 | Temp 98.0°F | Ht 65.0 in | Wt 298.0 lb

## 2022-06-23 DIAGNOSIS — F988 Other specified behavioral and emotional disorders with onset usually occurring in childhood and adolescence: Secondary | ICD-10-CM | POA: Insufficient documentation

## 2022-06-23 DIAGNOSIS — F32A Depression, unspecified: Secondary | ICD-10-CM

## 2022-06-23 DIAGNOSIS — F419 Anxiety disorder, unspecified: Secondary | ICD-10-CM

## 2022-06-23 DIAGNOSIS — F39 Unspecified mood [affective] disorder: Secondary | ICD-10-CM | POA: Insufficient documentation

## 2022-06-23 DIAGNOSIS — F908 Attention-deficit hyperactivity disorder, other type: Secondary | ICD-10-CM | POA: Diagnosis not present

## 2022-06-23 NOTE — Telephone Encounter (Signed)
Please see patient message.

## 2022-06-23 NOTE — Patient Instructions (Addendum)
Welcome to [facility]! It was a pleasure meeting you today.  As discussed, Please schedule a 2-4 week follow up visit today.  Decrease sertraline dose to 50 mg once daily Stop buspirone (buspar) altogether Message [place] with dose of Wellbutrin you are taking   Referral to psychiatry within [facility]   If you develop suicidal thoughts, please tell someone and immediately proceed to our local 24/7 crisis center, [facility].[address] [place], [place] [phone].   PLEASE NOTE:  If you had any LAB tests please let us know if you have not heard back within a few days. You may see your results on MyChart before we have a chance to review them but we will give you a call once they are reviewed by [place]. If we ordered any REFERRALS today, please let us know if you have not heard from their office within the next two weeks. Let us know through MyChart if you are needing REFILLS, or have your pharmacy send [place] the request. You can also use MyChart to communicate with me or any office staff.  Please try these tips to maintain a healthy lifestyle:  Eat most of your calories during the day when you are active. Eliminate processed foods including packaged sweets (pies, cakes, cookies), reduce intake of potatoes, white bread, white pasta, and white rice. Look for whole grain options, oat flour or almond flour.  Each meal should contain half fruits/vegetables, one quarter protein, and one quarter carbs (no bigger than a computer mouse).  Cut down on sweet beverages. This includes juice, soda, and sweet tea. Also watch fruit intake, though this is a healthier sweet option, it still contains natural sugar! Limit to 3 servings daily.  Drink at least 1 glass of water with each meal and aim for at least 8 glasses (64 ounces) per day.  Exercise at least 150 minutes every week to the best of your  ability.    Take Care,  Verland Sprinkle, PA-C

## 2022-06-23 NOTE — Progress Notes (Signed)
Subjective:    Patient ID: Margaret Reed, female    DOB: 1996/01/27, 26 y.o.   MRN: [id number]  Chief Complaint  Patient presents with   New Patient (Initial Visit)    NP in office to establish care w/ PCP; pt c/o bump lower back above butt and started as scab and not going away not healing, pt also needing to discuss her medications specifically ADHD meds to manage them for her; pt has noticed in the past year her feet are hurting after standing and walking. Pt also has known knee issues, [facility] has evaluated knees; PT starts in January.     HPI 26 y.o. patient presents today for new patient establishment with me.  Patient hasn't had PCP in awhile. Estranged from parents and siblings. Engaged x 2 years, living together. Works for [employer]   Current Care Team: Psychiatry - online service; no longer seeing them   Acute Concerns: Desiring change in psych medications Zoloft - has completely torn up her stomach and she states this has stopped her sex drive altogether.  States she does best with mood stabilizer and ADHD medication alone.   Past Medical History:  Diagnosis Date   ADHD    Anemia    Anxiety    Asthma    Depression     Past Surgical History:  Procedure Laterality Date   TONSILLECTOMY     WISDOM TOOTH EXTRACTION      Family History  Problem Relation Age of Onset   Asthma Mother    Alcohol abuse Mother    Drug abuse Father     Social History   Tobacco Use   Smoking status: Never   Smokeless tobacco: Never  Vaping Use   Vaping Use: Never used  Substance Use Topics   Alcohol use: Yes    Comment: occasionally   Drug use: Never     No Known Allergies  Review of Systems NEGATIVE UNLESS OTHERWISE INDICATED IN HPI      Objective:     BP 132/86   Pulse 82   Temp 98 F (36.7 C) (Temporal)   Ht 5\' 5"  (1.651 m)   Wt 298 lb (135.2 kg)   SpO2 97%   BMI 49.59 kg/m   Wt Readings from Last 3 Encounters:  06/23/22 298 lb (135.2 kg)  08/08/21  284 lb (128.8 kg)  03/24/21 284 lb 12.8 oz (129.2 kg)    BP Readings from Last 3 Encounters:  06/23/22 132/86  03/24/21 108/64  12/31/20 100/72     Physical Exam Vitals and nursing note reviewed.  Constitutional:      Appearance: Normal appearance. She is morbidly obese.  Cardiovascular:     Rate and Rhythm: Normal rate and regular rhythm.     Pulses: Normal pulses.  Pulmonary:     Effort: Pulmonary effort is normal.     Breath sounds: Normal breath sounds.  Neurological:     General: No focal deficit present.     Mental Status: She is alert and oriented to person, place, and time.  Psychiatric:        Mood and Affect: Mood normal.        Behavior: Behavior normal.        Assessment & Plan:  Anxiety and depression -     Ambulatory referral to Psychiatry  Mood disorder [facility]) -     Ambulatory referral to Psychiatry  Attention deficit hyperactivity disorder (ADHD), other type -     Ambulatory referral  to Psychiatry   New pt establishment with significant psychiatry history. Having issues with current medication regimen.   Decrease sertraline dose to 50 mg once daily to hopefully improve sex drive.  Stop buspirone (buspar) altogether (ineffective).  Message [place] with dose of Wellbutrin you are taking   Referral to psychiatry within [facility] - I can help manage medication until this appointment.    [facility] Visit from 06/23/2022 in [place]  PHQ-9 Total Score 20         06/23/2022    1:49 PM  GAD 7 : Generalized Anxiety Score  Nervous, Anxious, on Edge 3  Control/stop worrying 3  Worry too much - different things 3  Trouble relaxing 3  Restless 3  Easily annoyed or irritable 3  Afraid - awful might happen 3  Total GAD 7 Score 21  Anxiety Difficulty Very difficult      Return in about 2 weeks (around 07/07/2022) for recheck.     Wakisha Alberts M Keiry Kowal, PA-C

## 2022-06-25 ENCOUNTER — Other Ambulatory Visit: Payer: Self-pay

## 2022-06-25 MED ORDER — LAMOTRIGINE 100 MG PO TABS: 100.0000 mg | ORAL_TABLET | Freq: Two times a day (BID) | ORAL | 0 refills | Status: DC

## 2022-07-16 ENCOUNTER — Encounter: Payer: Self-pay | Admitting: Physician Assistant

## 2022-07-16 ENCOUNTER — Ambulatory Visit: Payer: 59 | Admitting: Physician Assistant

## 2022-07-16 VITALS — BP 124/72 | HR 82 | Temp 97.5°F | Ht 65.0 in | Wt 305.8 lb

## 2022-07-16 DIAGNOSIS — F419 Anxiety disorder, unspecified: Secondary | ICD-10-CM | POA: Diagnosis not present

## 2022-07-16 DIAGNOSIS — F908 Attention-deficit hyperactivity disorder, other type: Secondary | ICD-10-CM

## 2022-07-16 DIAGNOSIS — F39 Unspecified mood [affective] disorder: Secondary | ICD-10-CM | POA: Diagnosis not present

## 2022-07-16 DIAGNOSIS — F32A Depression, unspecified: Secondary | ICD-10-CM | POA: Diagnosis not present

## 2022-07-16 MED ORDER — AZSTARYS 52.3-10.4 MG PO CAPS: 1.0000 | ORAL_CAPSULE | Freq: Every day | ORAL | 0 refills | Status: DC

## 2022-07-16 NOTE — Assessment & Plan Note (Signed)
Stable on Azstarys 52.3-10.4 mg 1 cap po daily. Refilled today.  PDMP reviewed today, no red flags, filling appropriately.

## 2022-07-16 NOTE — Assessment & Plan Note (Signed)
Stable. Doing well lowering dose of sertraline due to libido issues and GI issues. Will d/c sertraline altogether. Continue Wellbutrin XL 300 mg.

## 2022-07-16 NOTE — Progress Notes (Signed)
Subjective:    Patient ID: Margaret Reed, female    DOB: 1995/08/20, 27 y.o.   MRN: [id number]  Chief Complaint  Patient presents with   Follow-up    Pt in office for f/u on anxiety and medication changes; pt states new meds seems to be helping a lot, and wanting to see if she can come off Sertraline all together and lower dosage for Wellbutrin to eventually com off of it. Taking so many pills makes pt feel bad about herself and pt states she is fine with just taking ADHD meds and mood stabilizer. Also, pt c/o of sore throat the past two days, sometimes hurts to swallow. Pt has appt with Behavioral health 1/24.     HPI Patient is in today for medication recheck. Still having stomach issues, but feeling much better since lowering the sertraline dose.   Needing Azstarys refilled today (took last pill this morning). States she takes it every morning otherwise has severe disassociation, sometimes even walking near traffic without realizing in the past. Adderall and Vyvanse became too expensive in the past.   She has an appointment with behavioral health later this month.  Has been working a lot recently, but feeling well overall.   Past Medical History:  Diagnosis Date   ADHD    Anemia    Anxiety    Asthma    Depression     Past Surgical History:  Procedure Laterality Date   TONSILLECTOMY     WISDOM TOOTH EXTRACTION      Family History  Problem Relation Age of Onset   Asthma Mother    Alcohol abuse Mother    Drug abuse Father     Social History   Tobacco Use   Smoking status: Never   Smokeless tobacco: Never  Vaping Use   Vaping Use: Never used  Substance Use Topics   Alcohol use: Yes    Comment: occasionally   Drug use: Never     No Known Allergies  Review of Systems NEGATIVE UNLESS OTHERWISE INDICATED IN HPI      Objective:     BP 124/72 (BP Location: Left Arm)   Pulse 82   Temp (!) 97.5 F (36.4 C) (Temporal)   Ht 5\' 5"  (1.651 m)   Wt (!) 305 lb  12.8 oz (138.7 kg)   LMP 07/01/2022 (Approximate)   SpO2 98%   BMI 50.89 kg/m   Wt Readings from Last 3 Encounters:  07/16/22 (!) 305 lb 12.8 oz (138.7 kg)  06/23/22 298 lb (135.2 kg)  08/08/21 284 lb (128.8 kg)    BP Readings from Last 3 Encounters:  07/16/22 124/72  06/23/22 132/86  03/24/21 108/64     Physical Exam Vitals and nursing note reviewed.  Constitutional:      Appearance: Normal appearance. She is morbidly obese.  HENT:     Mouth/Throat:     Mouth: Mucous membranes are moist.     Pharynx: No oropharyngeal exudate or posterior oropharyngeal erythema.  Eyes:     Extraocular Movements: Extraocular movements intact.     Conjunctiva/sclera: Conjunctivae normal.     Pupils: Pupils are equal, round, and reactive to light.  Cardiovascular:     Rate and Rhythm: Normal rate and regular rhythm.     Pulses: Normal pulses.  Pulmonary:     Effort: Pulmonary effort is normal.     Breath sounds: Normal breath sounds.  Neurological:     General: No focal deficit present.  Mental Status: She is alert and oriented to person, place, and time.  Psychiatric:        Mood and Affect: Mood normal.        Behavior: Behavior normal.        Assessment & Plan:  Anxiety and depression Assessment & Plan: Stable. Doing well lowering dose of sertraline due to libido issues and GI issues. Will d/c sertraline altogether. Continue Wellbutrin XL 300 mg.    Mood disorder (HCC) Assessment & Plan: Stable on lamictal 100 mg BID. Will f/up with behavioral health this month.    Attention deficit hyperactivity disorder (ADHD), other type Assessment & Plan: Stable on Azstarys 52.3-10.4 mg 1 cap po daily. Refilled today.  PDMP reviewed today, no red flags, filling appropriately.     Other orders -     Azstarys; Take 1 capsule by mouth daily.  Dispense: 30 capsule; Refill: 0        Return in about 6 weeks (around 08/27/2022) for recheck .  This note was prepared with  assistance of Systems analyst. Occasional wrong-word or sound-a-like substitutions may have occurred due to the inherent limitations of voice recognition software.     Jyl Chico M Ineta Sinning, PA-C

## 2022-07-16 NOTE — Assessment & Plan Note (Signed)
Stable on lamictal 100 mg BID. Will f/up with behavioral health this month.

## 2022-08-05 ENCOUNTER — Ambulatory Visit (HOSPITAL_BASED_OUTPATIENT_CLINIC_OR_DEPARTMENT_OTHER): Payer: 59 | Admitting: Psychiatry

## 2022-08-05 VITALS — BP 114/76 | HR 76 | Resp 20 | Ht 65.0 in | Wt 297.4 lb

## 2022-08-05 DIAGNOSIS — F419 Anxiety disorder, unspecified: Secondary | ICD-10-CM | POA: Diagnosis not present

## 2022-08-05 DIAGNOSIS — F32A Depression, unspecified: Secondary | ICD-10-CM

## 2022-08-05 MED ORDER — BUPROPION HCL ER (XL) 150 MG PO TB24: 150.0000 mg | ORAL_TABLET | ORAL | 1 refills | Status: DC
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
## 2022-08-06 ENCOUNTER — Telehealth (HOSPITAL_COMMUNITY): Payer: Self-pay | Admitting: *Deleted

## 2022-08-06 ENCOUNTER — Encounter (HOSPITAL_COMMUNITY): Payer: Self-pay | Admitting: Psychiatry

## 2022-08-06 NOTE — Telephone Encounter (Signed)
Pt called to advise that she has been taking the Lamictal BID as prescribed by her PCP. Juluis Rainier.

## 2022-08-14 ENCOUNTER — Other Ambulatory Visit: Payer: Self-pay | Admitting: Physician Assistant

## 2022-08-17 MED ORDER — AZSTARYS 52.3-10.4 MG PO CAPS: 1.0000 | ORAL_CAPSULE | Freq: Every day | ORAL | 0 refills | Status: DC

## 2022-08-17 NOTE — Telephone Encounter (Signed)
Last OV: 07/16/22  Next OV: 08/28/22  Last filled: 07/16/22  Quantity: 30 w/ 0 refills

## 2022-08-28 ENCOUNTER — Ambulatory Visit: Payer: 59 | Admitting: Physician Assistant

## 2022-08-28 ENCOUNTER — Encounter: Payer: Self-pay | Admitting: Physician Assistant

## 2022-08-28 VITALS — BP 126/81 | HR 91 | Temp 97.7°F | Ht 65.0 in | Wt 297.4 lb

## 2022-08-28 DIAGNOSIS — Z131 Encounter for screening for diabetes mellitus: Secondary | ICD-10-CM

## 2022-08-28 DIAGNOSIS — F39 Unspecified mood [affective] disorder: Secondary | ICD-10-CM | POA: Diagnosis not present

## 2022-08-28 DIAGNOSIS — F908 Attention-deficit hyperactivity disorder, other type: Secondary | ICD-10-CM | POA: Diagnosis not present

## 2022-08-28 DIAGNOSIS — F32A Depression, unspecified: Secondary | ICD-10-CM | POA: Diagnosis not present

## 2022-08-28 DIAGNOSIS — F419 Anxiety disorder, unspecified: Secondary | ICD-10-CM | POA: Diagnosis not present

## 2022-08-28 DIAGNOSIS — R5383 Other fatigue: Secondary | ICD-10-CM | POA: Diagnosis not present

## 2022-08-28 NOTE — Assessment & Plan Note (Signed)
Stable on Azstarys 52.3-10.4 mg 1 cap po daily  Will call when due for refill

## 2022-08-28 NOTE — Progress Notes (Signed)
Subjective:    Patient ID: Margaret Reed, female    DOB: 1995-09-22, 27 y.o.   MRN: [id number]  Chief Complaint  Patient presents with   Follow-up    Pt states doing better but can't tell if meds are working; very overwhelmed and stressed, doesn't want to get out of bed to do basic life tasks, also having a problem regulating emotions per patient    HPI Patient is in today for recheck.  Feeling tired this morning. Sleeping in late and leaving late more often lately. Needing more sleep lately. Fiance had foot surgery, so she's sleeping in the living room so she doesn't accidentally bump into his foot.   Memory issues - the past three years seems worse - wondering if this is medication or something going on with her. No family hx of dementia she knows of .   She had first appt with Dr. Nelida Gores on 08/05/22. Plan from him includes: Plan: - Continue Lamictal 100 mg BID - Taper Wellbutrin XL 150 mg QD - Continue Azstarys 52.43m SDX/10.4 mg d-MPH managed by PCP - Neuropsych referral - Recommend evaluation for dental device for sleep apnea - Consider therapy referral in the future - Crisis resources reviewed - Follow up in a month  Past Medical History:  Diagnosis Date   ADHD    Anemia    Anxiety    Asthma    Depression     Past Surgical History:  Procedure Laterality Date   TONSILLECTOMY     WISDOM TOOTH EXTRACTION      Family History  Problem Relation Age of Onset   Asthma Mother    Alcohol abuse Mother    Drug abuse Father     Social History   Tobacco Use   Smoking status: Never   Smokeless tobacco: Never  Vaping Use   Vaping Use: Never used  Substance Use Topics   Alcohol use: Yes    Comment: occasionally   Drug use: Never     No Known Allergies  Review of Systems NEGATIVE UNLESS OTHERWISE INDICATED IN HPI      Objective:     BP 126/81 (BP Location: Left Arm)   Pulse 91   Temp 97.7 F (36.5 C) (Temporal)   Ht 5' 5"$  (1.651 m)   Wt 297 lb 6.4 oz  (134.9 kg)   SpO2 98%   BMI 49.49 kg/m   Wt Readings from Last 3 Encounters:  08/28/22 297 lb 6.4 oz (134.9 kg)  08/05/22 297 lb 6.4 oz (134.9 kg)  07/16/22 (!) 305 lb 12.8 oz (138.7 kg)    BP Readings from Last 3 Encounters:  08/28/22 126/81  08/05/22 114/76  07/16/22 124/72     Physical Exam Vitals and nursing note reviewed.  Constitutional:      Appearance: Normal appearance. She is morbidly obese.  HENT:     Mouth/Throat:     Mouth: Mucous membranes are moist.     Pharynx: No oropharyngeal exudate or posterior oropharyngeal erythema.  Eyes:     Extraocular Movements: Extraocular movements intact.     Conjunctiva/sclera: Conjunctivae normal.     Pupils: Pupils are equal, round, and reactive to light.  Cardiovascular:     Rate and Rhythm: Normal rate and regular rhythm.     Pulses: Normal pulses.  Pulmonary:     Effort: Pulmonary effort is normal.     Breath sounds: Normal breath sounds.  Neurological:     General: No focal deficit present.  Mental Status: She is alert and oriented to person, place, and time.  Psychiatric:        Mood and Affect: Mood normal.        Behavior: Behavior normal.        Assessment & Plan:  Anxiety and depression Assessment & Plan:    08/28/2022    8:46 AM 08/05/2022    3:07 PM 06/23/2022    1:49 PM  GAD 7 : Generalized Anxiety Score  Nervous, Anxious, on Edge 3 3 3  $ Control/stop worrying 3 3 3  $ Worry too much - different things 3 3 3  $ Trouble relaxing 3 2 3  $ Restless 2 2 3  $ Easily annoyed or irritable 3 3 3  $ Afraid - awful might happen 3 1 3  $ Total GAD 7 Score 20 17 21  $ Anxiety Difficulty Extremely difficult  Very difficult    Following with Psychiatry   Mood disorder [facility]) Assessment & Plan: [place] Office Visit from 08/28/2022 in [place]  PHQ-9 Total Score 25      Following with psychiatry Denies any SI or plans   Orders: -     Iron, TIBC and Ferritin Panel -     VITAMIN  D 25 Hydroxy (Vit-D Deficiency, Fractures) -     Vitamin B12 -     TSH -     Comprehensive metabolic panel -     CBC with Differential/Platelet -     Hemoglobin A1c  Attention deficit hyperactivity disorder (ADHD), other type Assessment & Plan: Stable on Azstarys 52.3-10.4 mg 1 cap po daily  Will call when due for refill    Other fatigue Assessment & Plan: Labs today, treat pending results Wonder about sleep issues too, consider sleep study   Orders: -     Iron, TIBC and Ferritin Panel -     VITAMIN D 25 Hydroxy (Vit-D Deficiency, Fractures) -     Vitamin B12 -     TSH -     Comprehensive metabolic panel -     CBC with Differential/Platelet -     Hemoglobin A1c  Diabetes mellitus screening -     Comprehensive metabolic panel -     Hemoglobin A1c        Return in about 3 months (around 11/26/2022) for med recheck.     Lauriel Helin M Elianah Karis, PA-C

## 2022-08-28 NOTE — Assessment & Plan Note (Signed)
    08/28/2022    8:46 AM 08/05/2022    3:07 PM 06/23/2022    1:49 PM  GAD 7 : Generalized Anxiety Score  Nervous, Anxious, on Edge 3 3 3  $ Control/stop worrying 3 3 3  $ Worry too much - different things 3 3 3  $ Trouble relaxing 3 2 3  $ Restless 2 2 3  $ Easily annoyed or irritable 3 3 3  $ Afraid - awful might happen 3 1 3  $ Total GAD 7 Score 20 17 21  $ Anxiety Difficulty Extremely difficult  Very difficult    Following with Psychiatry

## 2022-08-28 NOTE — Assessment & Plan Note (Signed)
Labs today, treat pending results Wonder about sleep issues too, consider sleep study

## 2022-08-28 NOTE — Assessment & Plan Note (Signed)
[facility] Office Visit from 08/28/2022 in [place]  PHQ-9 Total Score 25      Following with psychiatry Denies any SI or plans

## 2022-08-31 ENCOUNTER — Other Ambulatory Visit (INDEPENDENT_AMBULATORY_CARE_PROVIDER_SITE_OTHER): Payer: 59

## 2022-08-31 DIAGNOSIS — F39 Unspecified mood [affective] disorder: Secondary | ICD-10-CM

## 2022-08-31 DIAGNOSIS — R5383 Other fatigue: Secondary | ICD-10-CM | POA: Diagnosis not present

## 2022-08-31 DIAGNOSIS — Z131 Encounter for screening for diabetes mellitus: Secondary | ICD-10-CM

## 2022-08-31 LAB — COMPREHENSIVE METABOLIC PANEL
ALT: 15 U/L (ref 0–35)
AST: 17 U/L (ref 0–37)
Albumin: 4.3 g/dL (ref 3.5–5.2)
Alkaline Phosphatase: 58 U/L (ref 39–117)
BUN: 13 mg/dL (ref 6–23)
CO2: 26 mEq/L (ref 19–32)
Calcium: 9.6 mg/dL (ref 8.4–10.5)
Chloride: 102 mEq/L (ref 96–112)
Creatinine, Ser: 0.9 mg/dL (ref 0.40–1.20)
GFR: 89.58 mL/min (ref >60.00)
Glucose, Bld: 97 mg/dL (ref 70–99)
Potassium: 4 mEq/L (ref 3.5–5.1)
Sodium: 138 mEq/L (ref 135–145)
Total Bilirubin: 0.9 mg/dL (ref 0.2–1.2)
Total Protein: 6.9 g/dL (ref 6.0–8.3)

## 2022-08-31 LAB — TSH: TSH: 4.88 u[IU]/mL (ref 0.35–5.50)

## 2022-08-31 LAB — CBC WITH DIFFERENTIAL/PLATELET
Basophils Absolute: 0.1 10*3/uL (ref 0.0–0.1)
Basophils Relative: 1 % (ref 0.0–3.0)
Eosinophils Absolute: 0.1 10*3/uL (ref 0.0–0.7)
Eosinophils Relative: 2.1 % (ref 0.0–5.0)
HCT: 39.5 % (ref 36.0–46.0)
Hemoglobin: 13.1 g/dL (ref 12.0–15.0)
Lymphocytes Relative: 30.6 % (ref 12.0–46.0)
Lymphs Abs: 2.2 10*3/uL (ref 0.7–4.0)
MCHC: 33.1 g/dL (ref 30.0–36.0)
MCV: 80.3 fl (ref 78.0–100.0)
Monocytes Absolute: 0.4 10*3/uL (ref 0.1–1.0)
Monocytes Relative: 5.8 % (ref 3.0–12.0)
Neutro Abs: 4.4 10*3/uL (ref 1.4–7.7)
Neutrophils Relative %: 60.5 % (ref 43.0–77.0)
Platelets: 387 10*3/uL (ref 150.0–400.0)
RBC: 4.92 Mil/uL (ref 3.87–5.11)
RDW: 14.8 % (ref 11.5–15.5)
WBC: 7.3 10*3/uL (ref 4.0–10.5)

## 2022-08-31 LAB — HEMOGLOBIN A1C: Hgb A1c MFr Bld: 5.7 % (ref 4.6–6.5)

## 2022-08-31 LAB — VITAMIN B12: Vitamin B-12: 285 pg/mL (ref 211–911)

## 2022-08-31 LAB — VITAMIN D 25 HYDROXY (VIT D DEFICIENCY, FRACTURES): VITD: 14.49 ng/mL — ABNORMAL LOW (ref 30.00–100.00)

## 2022-09-01 ENCOUNTER — Encounter: Payer: Self-pay | Admitting: Physician Assistant

## 2022-09-01 DIAGNOSIS — E559 Vitamin D deficiency, unspecified: Secondary | ICD-10-CM

## 2022-09-01 LAB — IRON,TIBC AND FERRITIN PANEL
%SAT: 21 % (calc) (ref 16–45)
Ferritin: 51 ng/mL (ref 16–154)
Iron: 77 ug/dL (ref 40–190)
TIBC: 364 mcg/dL (calc) (ref 250–450)

## 2022-09-01 MED ORDER — VITAMIN D (ERGOCALCIFEROL) 1.25 MG (50000 UNIT) PO CAPS: 50000.0000 [IU] | ORAL_CAPSULE | ORAL | 0 refills | Status: DC

## 2022-09-03 ENCOUNTER — Encounter (HOSPITAL_COMMUNITY): Payer: Self-pay | Admitting: Psychiatry

## 2022-09-03 ENCOUNTER — Telehealth (HOSPITAL_COMMUNITY): Payer: 59 | Admitting: Psychiatry

## 2022-09-03 DIAGNOSIS — F32A Depression, unspecified: Secondary | ICD-10-CM

## 2022-09-03 DIAGNOSIS — F419 Anxiety disorder, unspecified: Secondary | ICD-10-CM

## 2022-09-03 MED ORDER — FLUOXETINE HCL 10 MG PO CAPS: ORAL_CAPSULE | ORAL | 0 refills | Status: DC

## 2022-09-03 MED ORDER — LAMOTRIGINE 100 MG PO TABS: 100.0000 mg | ORAL_TABLET | Freq: Two times a day (BID) | ORAL | 0 refills | Status: DC
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
## 2022-09-16 ENCOUNTER — Other Ambulatory Visit: Payer: Self-pay | Admitting: Physician Assistant

## 2022-09-16 MED ORDER — AZSTARYS 52.3-10.4 MG PO CAPS: 1.0000 | ORAL_CAPSULE | Freq: Every day | ORAL | 0 refills | Status: DC

## 2022-09-16 NOTE — Telephone Encounter (Signed)
Last refill: 08/17/22 #30, 0 Last OV: 08/28/22 dx. Anxiety and depression

## 2022-09-26 ENCOUNTER — Other Ambulatory Visit (HOSPITAL_COMMUNITY): Payer: Self-pay | Admitting: Psychiatry

## 2022-09-26 DIAGNOSIS — F32A Depression, unspecified: Secondary | ICD-10-CM

## 2022-10-02 ENCOUNTER — Telehealth (HOSPITAL_BASED_OUTPATIENT_CLINIC_OR_DEPARTMENT_OTHER): Payer: 59 | Admitting: Psychiatry

## 2022-10-02 ENCOUNTER — Encounter (HOSPITAL_COMMUNITY): Payer: Self-pay | Admitting: Psychiatry

## 2022-10-02 DIAGNOSIS — F419 Anxiety disorder, unspecified: Secondary | ICD-10-CM | POA: Diagnosis not present

## 2022-10-02 DIAGNOSIS — F32A Anxiety disorder, unspecified: Secondary | ICD-10-CM

## 2022-10-02 MED ORDER — FLUOXETINE HCL 20 MG PO CAPS: 20.0000 mg | ORAL_CAPSULE | Freq: Every day | ORAL | 2 refills | Status: DC

## 2022-10-02 MED ORDER — FLUOXETINE HCL 10 MG PO CAPS: 10.0000 mg | ORAL_CAPSULE | Freq: Every day | ORAL | 2 refills | Status: DC
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
## 2022-10-12 ENCOUNTER — Other Ambulatory Visit: Payer: Self-pay | Admitting: Physician Assistant

## 2022-10-12 MED ORDER — AZSTARYS 52.3-10.4 MG PO CAPS: 1.0000 | ORAL_CAPSULE | Freq: Every day | ORAL | 0 refills | Status: DC

## 2022-10-12 NOTE — Telephone Encounter (Signed)
Last OV: 08/28/22  Next OV: 11/26/22  Last filled: 09/16/22  Quantity: 30 w/ 0 refills

## 2022-10-21 LAB — HM PAP SMEAR: HM Pap smear: NEGATIVE

## 2022-10-25 ENCOUNTER — Other Ambulatory Visit (HOSPITAL_COMMUNITY): Payer: Self-pay | Admitting: Psychiatry

## 2022-10-25 DIAGNOSIS — F419 Anxiety disorder, unspecified: Secondary | ICD-10-CM

## 2022-11-12 ENCOUNTER — Ambulatory Visit: Payer: 59 | Admitting: Physician Assistant

## 2022-11-12 VITALS — BP 112/76 | HR 94 | Temp 97.8°F | Ht 65.0 in | Wt 294.8 lb

## 2022-11-12 DIAGNOSIS — F909 Attention-deficit hyperactivity disorder, unspecified type: Secondary | ICD-10-CM

## 2022-11-12 DIAGNOSIS — H9203 Otalgia, bilateral: Secondary | ICD-10-CM | POA: Diagnosis not present

## 2022-11-12 DIAGNOSIS — L03317 Cellulitis of buttock: Secondary | ICD-10-CM

## 2022-11-12 MED ORDER — AZSTARYS 52.3-10.4 MG PO CAPS
1.0000 | ORAL_CAPSULE | Freq: Every day | ORAL | 0 refills | Status: AC
Start: 2022-11-12 — End: 2022-12-12

## 2022-11-12 MED ORDER — AZSTARYS 52.3-10.4 MG PO CAPS: 1.0000 | ORAL_CAPSULE | Freq: Every day | ORAL | 0 refills | Status: DC

## 2022-11-12 MED ORDER — DOXYCYCLINE HYCLATE 100 MG PO TABS
100.0000 mg | ORAL_TABLET | Freq: Two times a day (BID) | ORAL | 0 refills | Status: AC
Start: 2022-11-12 — End: 2022-11-19

## 2022-11-12 MED ORDER — PSEUDOEPHEDRINE HCL 60 MG PO TABS
60.0000 mg | ORAL_TABLET | ORAL | 0 refills | Status: AC | PRN
Start: 2022-11-12 — End: 2022-11-17

## 2022-11-12 NOTE — Patient Instructions (Signed)
Good to see you today! ADHD medication refilled, see you back in 3 months for follow-up.  Take the doxycycline and sudafed as directed. Continue use of nasal saline and allergy medication. Let me know if still not improving in next few weeks.

## 2022-11-12 NOTE — Assessment & Plan Note (Signed)
Stable on Azstarys 52.3-10.4 mg 1 cap po daily  PDMP reviewed today, no red flags, filling appropriately.  3 month supply refilled today

## 2022-11-12 NOTE — Progress Notes (Signed)
Subjective:    Patient ID: Margaret Reed, female    DOB: 06-Dec-1995, 27 y.o.   MRN: [id number]  Chief Complaint  Patient presents with   Ear Fullness    Pt in office for possible ear infection/ fluid build up; pt c/o ear pain in both ears; was seen at UC last week and was told to take allergy meds due to fluid in ears; the bump she mentioned in previous appt on left side is now painful and feels hard, wanting to get it looked at;      Ear Fullness    Patient is in today for bilateral ear pain going on for a few weeks. Using allergy medication and Flonase after UC visit with minimal relief. No fever or chills. No other symptoms related.  Painful bump left lower back / buttock.  Needs ADHD meds refilled.   Past Medical History:  Diagnosis Date   ADHD    Anemia    Anxiety    Asthma    Depression     Past Surgical History:  Procedure Laterality Date   TONSILLECTOMY     WISDOM TOOTH EXTRACTION      Family History  Problem Relation Age of Onset   Asthma Mother    Alcohol abuse Mother    Drug abuse Father     Social History   Tobacco Use   Smoking status: Never   Smokeless tobacco: Never  Vaping Use   Vaping Use: Never used  Substance Use Topics   Alcohol use: Yes    Comment: occasionally   Drug use: Never     No Known Allergies  Review of Systems NEGATIVE UNLESS OTHERWISE INDICATED IN HPI      Objective:     BP 112/76 (BP Location: Right Arm)   Pulse 94   Temp 97.8 F (36.6 C) (Temporal)   Ht 5\' 5"  (1.651 m)   Wt 294 lb 12.8 oz (133.7 kg)   SpO2 98%   BMI 49.06 kg/m   Wt Readings from Last 3 Encounters:  11/12/22 294 lb 12.8 oz (133.7 kg)  08/28/22 297 lb 6.4 oz (134.9 kg)  08/05/22 297 lb 6.4 oz (134.9 kg)    BP Readings from Last 3 Encounters:  11/12/22 112/76  08/28/22 126/81  08/05/22 114/76     Physical Exam Vitals and nursing note reviewed.  Constitutional:      General: She is not in acute distress.    Appearance: Normal  appearance. She is not ill-appearing.  HENT:     Head: Normocephalic.     Right Ear: Tympanic membrane, ear canal and external ear normal. There is no impacted cerumen.     Left Ear: Tympanic membrane, ear canal and external ear normal. There is no impacted cerumen.     Nose: No congestion.     Mouth/Throat:     Mouth: Mucous membranes are moist.     Pharynx: No oropharyngeal exudate or posterior oropharyngeal erythema.  Eyes:     Extraocular Movements: Extraocular movements intact.     Conjunctiva/sclera: Conjunctivae normal.     Pupils: Pupils are equal, round, and reactive to light.  Cardiovascular:     Rate and Rhythm: Normal rate and regular rhythm.     Pulses: Normal pulses.     Heart sounds: Normal heart sounds. No murmur heard. Pulmonary:     Effort: Pulmonary effort is normal. No respiratory distress.     Breath sounds: Normal breath sounds. No wheezing.  Musculoskeletal:  Cervical back: Normal range of motion.  Skin:    General: Skin is warm.     Findings: Lesion (approx 1-2 mm lesion left lower back / buttock that is erythmatous and painful) present.  Neurological:     Mental Status: She is alert and oriented to person, place, and time.  Psychiatric:        Mood and Affect: Mood normal.        Behavior: Behavior normal.        Assessment & Plan:  Ear pain, bilateral -     Doxycycline Hyclate; Take 1 tablet (100 mg total) by mouth 2 (two) times daily for 7 days.  Dispense: 14 tablet; Refill: 0 -     Pseudoephedrine HCl; Take 1 tablet (60 mg total) by mouth every 4 (four) hours as needed for up to 5 days for congestion.  Dispense: 30 tablet; Refill: 0  Cellulitis of left buttock -     Doxycycline Hyclate; Take 1 tablet (100 mg total) by mouth 2 (two) times daily for 7 days.  Dispense: 14 tablet; Refill: 0  Attention deficit hyperactivity disorder (ADHD), unspecified ADHD type Assessment & Plan: Stable on Azstarys 52.3-10.4 mg 1 cap po daily  PDMP reviewed  today, no red flags, filling appropriately.  3 month supply refilled today   Orders: -     Azstarys; Take 1 capsule by mouth daily.  Dispense: 30 capsule; Refill: 0 -     Azstarys; Take 1 capsule by mouth daily.  Dispense: 30 capsule; Refill: 0 -     Azstarys; Take 1 capsule by mouth daily.  Dispense: 30 capsule; Refill: 0        Return in about 3 months (around 02/12/2023) for recheck/follow-up.    Dann Galicia M Duvid Smalls, PA-C

## 2022-11-13 ENCOUNTER — Telehealth (HOSPITAL_COMMUNITY): Payer: 59 | Admitting: Psychiatry

## 2022-11-17 ENCOUNTER — Encounter: Payer: Self-pay | Admitting: Physician Assistant

## 2022-11-19 ENCOUNTER — Encounter (HOSPITAL_COMMUNITY): Payer: Self-pay | Admitting: Psychiatry

## 2022-11-19 ENCOUNTER — Telehealth (HOSPITAL_BASED_OUTPATIENT_CLINIC_OR_DEPARTMENT_OTHER): Payer: 59 | Admitting: Psychiatry

## 2022-11-19 ENCOUNTER — Other Ambulatory Visit (HOSPITAL_COMMUNITY): Payer: Self-pay | Admitting: Psychiatry

## 2022-11-19 DIAGNOSIS — F32A Depression, unspecified: Secondary | ICD-10-CM | POA: Diagnosis not present

## 2022-11-19 DIAGNOSIS — F419 Anxiety disorder, unspecified: Secondary | ICD-10-CM | POA: Diagnosis not present

## 2022-11-19 MED ORDER — FLUOXETINE HCL 40 MG PO CAPS
40.0000 mg | ORAL_CAPSULE | Freq: Every day | ORAL | 2 refills | Status: DC
Start: 2022-11-19 — End: 2022-11-19

## 2022-11-19 MED ORDER — LAMOTRIGINE 100 MG PO TABS: 100.0000 mg | ORAL_TABLET | Freq: Two times a day (BID) | ORAL | 0 refills | Status: DC
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
## 2022-11-26 ENCOUNTER — Ambulatory Visit: Payer: 59 | Admitting: Physician Assistant

## 2022-11-26 ENCOUNTER — Encounter: Payer: Self-pay | Admitting: Physician Assistant

## 2022-11-26 VITALS — BP 122/82 | HR 97 | Temp 97.5°F | Ht 65.0 in | Wt 294.4 lb

## 2022-11-26 DIAGNOSIS — F909 Attention-deficit hyperactivity disorder, unspecified type: Secondary | ICD-10-CM | POA: Diagnosis not present

## 2022-11-26 DIAGNOSIS — H9203 Otalgia, bilateral: Secondary | ICD-10-CM

## 2022-11-26 MED ORDER — LORATADINE-PSEUDOEPHEDRINE ER 10-240 MG PO TB24
1.0000 | ORAL_TABLET | Freq: Every day | ORAL | 0 refills | Status: DC
Start: 2022-11-26 — End: 2023-08-16

## 2022-11-26 MED ORDER — METHYLPREDNISOLONE 4 MG PO TBPK
ORAL_TABLET | ORAL | 0 refills | Status: DC
Start: 2022-11-26 — End: 2023-02-12

## 2022-11-26 NOTE — Assessment & Plan Note (Signed)
Stable on Azstarys 52.3-10.4 mg 1 cap po daily  PDMP reviewed today, no red flags, filling appropriately.  Just recently filled; will call for refills

## 2022-11-26 NOTE — Progress Notes (Signed)
Subjective:    Patient ID: Margaret Reed, female    DOB: 1996-01-17, 27 y.o.   MRN: [id number]  Chief Complaint  Patient presents with   Medical Management of Chronic Issues    Pt in office for 3 mon f/u and med check; pt still c/o mainly left ear pain and fullness again after finishing antibiotics. Last week seen in UC and tested positive for flu;     HPI Patient is in today for 3 month med recheck. See A/P.  Also, still having left ear pain / pressure / fullness. Just feels like she needs to pull something out of her ears. She felt better with the doxycycline, but then got Flu A, started having pressure build back up again.   Past Medical History:  Diagnosis Date   ADHD    Anemia    Anxiety    Asthma    Depression     Past Surgical History:  Procedure Laterality Date   TONSILLECTOMY     WISDOM TOOTH EXTRACTION      Family History  Problem Relation Age of Onset   Asthma Mother    Alcohol abuse Mother    Drug abuse Father     Social History   Tobacco Use   Smoking status: Never   Smokeless tobacco: Never  Vaping Use   Vaping Use: Never used  Substance Use Topics   Alcohol use: Yes    Comment: occasionally   Drug use: Never     No Known Allergies  Review of Systems NEGATIVE UNLESS OTHERWISE INDICATED IN HPI      Objective:     BP 122/82 (BP Location: Left Arm)   Pulse 97   Temp (!) 97.5 F (36.4 C) (Temporal)   Ht 5\' 5"  (1.651 m)   Wt 294 lb 6.4 oz (133.5 kg)   LMP 11/25/2022 (Exact Date)   SpO2 98%   BMI 48.99 kg/m   Wt Readings from Last 3 Encounters:  11/26/22 294 lb 6.4 oz (133.5 kg)  11/12/22 294 lb 12.8 oz (133.7 kg)  08/28/22 297 lb 6.4 oz (134.9 kg)    BP Readings from Last 3 Encounters:  11/26/22 122/82  11/12/22 112/76  08/28/22 126/81     Physical Exam Vitals and nursing note reviewed.  Constitutional:      General: She is not in acute distress.    Appearance: Normal appearance. She is not ill-appearing.  HENT:      Head: Normocephalic.     Right Ear: Tympanic membrane, ear canal and external ear normal.     Left Ear: Tympanic membrane, ear canal and external ear normal.     Nose: No congestion.     Mouth/Throat:     Mouth: Mucous membranes are moist.     Pharynx: No oropharyngeal exudate or posterior oropharyngeal erythema.  Eyes:     Extraocular Movements: Extraocular movements intact.     Conjunctiva/sclera: Conjunctivae normal.     Pupils: Pupils are equal, round, and reactive to light.  Cardiovascular:     Rate and Rhythm: Normal rate and regular rhythm.     Pulses: Normal pulses.     Heart sounds: Normal heart sounds. No murmur heard. Pulmonary:     Effort: Pulmonary effort is normal. No respiratory distress.     Breath sounds: Normal breath sounds. No wheezing.  Musculoskeletal:     Cervical back: Normal range of motion.  Skin:    General: Skin is warm.  Neurological:  Mental Status: She is alert and oriented to person, place, and time.  Psychiatric:        Mood and Affect: Mood normal.        Behavior: Behavior normal.        Assessment & Plan:  Attention deficit hyperactivity disorder (ADHD), unspecified ADHD type Assessment & Plan: Stable on Azstarys 52.3-10.4 mg 1 cap po daily  PDMP reviewed today, no red flags, filling appropriately.  Just recently filled; will call for refills   Ear pain, bilateral -     methylPREDNISolone; Please take per packaging instructions.  Dispense: 21 tablet; Refill: 0 -     Loratadine-Pseudoephedrine ER; Take 1 tablet by mouth daily.  Dispense: 30 tablet; Refill: 0  --Reassured pt no infection, like allergy / ETD; will trial medrol dose pak and Claritin-D for relief. Pt to let me know how she is doing.       Return in about 3 months (around 02/26/2023) for recheck/follow-up.    Jaydenn Boccio M Dedra Matsuo, PA-C

## 2022-12-12 ENCOUNTER — Encounter: Payer: Self-pay | Admitting: Physician Assistant

## 2022-12-14 ENCOUNTER — Other Ambulatory Visit: Payer: Self-pay | Admitting: Physician Assistant

## 2022-12-14 DIAGNOSIS — F909 Attention-deficit hyperactivity disorder, unspecified type: Secondary | ICD-10-CM

## 2022-12-15 MED ORDER — AZSTARYS 52.3-10.4 MG PO CAPS
1.0000 | ORAL_CAPSULE | Freq: Every day | ORAL | 0 refills | Status: DC
Start: 2022-12-15 — End: 2023-01-08

## 2022-12-16 ENCOUNTER — Other Ambulatory Visit (HOSPITAL_COMMUNITY): Payer: Self-pay | Admitting: Psychiatry

## 2022-12-16 DIAGNOSIS — F32A Depression, unspecified: Secondary | ICD-10-CM
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
## 2022-12-31 ENCOUNTER — Encounter (HOSPITAL_COMMUNITY): Payer: Self-pay

## 2022-12-31 ENCOUNTER — Encounter (HOSPITAL_COMMUNITY): Payer: 59 | Admitting: Psychiatry

## 2022-12-31 NOTE — Progress Notes (Signed)
This encounter was created in error - please disregard.

## 2023-01-08 ENCOUNTER — Encounter (HOSPITAL_COMMUNITY): Payer: Self-pay | Admitting: Psychiatry

## 2023-01-08 ENCOUNTER — Telehealth (HOSPITAL_BASED_OUTPATIENT_CLINIC_OR_DEPARTMENT_OTHER): Payer: 59 | Admitting: Psychiatry

## 2023-01-08 DIAGNOSIS — F419 Anxiety disorder, unspecified: Secondary | ICD-10-CM

## 2023-01-08 DIAGNOSIS — F32A Depression, unspecified: Secondary | ICD-10-CM | POA: Diagnosis not present

## 2023-01-08 DIAGNOSIS — F909 Attention-deficit hyperactivity disorder, unspecified type: Secondary | ICD-10-CM

## 2023-01-08 MED ORDER — AZSTARYS 52.3-10.4 MG PO CAPS: 1.0000 | ORAL_CAPSULE | Freq: Every day | ORAL | 0 refills | Status: DC
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
## 2023-02-10 ENCOUNTER — Encounter (INDEPENDENT_AMBULATORY_CARE_PROVIDER_SITE_OTHER): Payer: Self-pay

## 2023-02-11 ENCOUNTER — Ambulatory Visit: Payer: 59 | Admitting: Physician Assistant

## 2023-02-12 ENCOUNTER — Ambulatory Visit: Payer: 59 | Admitting: Physician Assistant

## 2023-02-12 ENCOUNTER — Encounter: Payer: Self-pay | Admitting: Physician Assistant

## 2023-02-12 VITALS — BP 93/61 | HR 85 | Temp 97.7°F | Ht 65.0 in | Wt 291.0 lb

## 2023-02-12 DIAGNOSIS — J020 Streptococcal pharyngitis: Secondary | ICD-10-CM

## 2023-02-12 DIAGNOSIS — U071 COVID-19: Secondary | ICD-10-CM

## 2023-02-12 LAB — POC COVID19 BINAXNOW: SARS Coronavirus 2 Ag: POSITIVE — AB

## 2023-02-12 LAB — POCT RAPID STREP A (OFFICE): Rapid Strep A Screen: POSITIVE — AB

## 2023-02-12 MED ORDER — AMOXICILLIN 500 MG PO CAPS
500.0000 mg | ORAL_CAPSULE | Freq: Two times a day (BID) | ORAL | 0 refills | Status: AC
Start: 2023-02-12 — End: 2023-02-22

## 2023-02-12 NOTE — Progress Notes (Signed)
Subjective:    Patient ID: Margaret Reed, female    DOB: Mar 07, 1996, 27 y.o.   MRN: [id number]  Chief Complaint  Patient presents with   Sore Throat   Fever   Nasal Congestion    HPI Patient is in today for acute sick symptoms x 2 days. ST, fever, chills, congestion, body aches. Boyfriend with symptoms earlier this week. No CP or SOB. No GI symptoms.   Past Medical History:  Diagnosis Date   ADHD    Anemia    Anxiety    Asthma    Depression     Past Surgical History:  Procedure Laterality Date   TONSILLECTOMY     WISDOM TOOTH EXTRACTION      Family History  Problem Relation Age of Onset   Asthma Mother    Alcohol abuse Mother    Drug abuse Father     Social History   Tobacco Use   Smoking status: Never   Smokeless tobacco: Never  Vaping Use   Vaping status: Never Used  Substance Use Topics   Alcohol use: Yes    Comment: occasionally   Drug use: Never     No Known Allergies  Review of Systems NEGATIVE UNLESS OTHERWISE INDICATED IN HPI      Objective:     BP 93/61   Pulse 85   Temp 97.7 F (36.5 C) (Temporal)   Ht 5\' 5"  (1.651 m)   Wt 291 lb (132 kg)   LMP 01/24/2023   SpO2 96%   BMI 48.42 kg/m   Wt Readings from Last 3 Encounters:  02/12/23 291 lb (132 kg)  11/26/22 294 lb 6.4 oz (133.5 kg)  11/12/22 294 lb 12.8 oz (133.7 kg)    BP Readings from Last 3 Encounters:  02/12/23 93/61  11/26/22 122/82  11/12/22 112/76     Physical Exam Vitals and nursing note reviewed.  Constitutional:      General: She is not in acute distress.    Appearance: Normal appearance. She is ill-appearing.  HENT:     Head: Normocephalic.     Right Ear: Tympanic membrane, ear canal and external ear normal.     Left Ear: Tympanic membrane, ear canal and external ear normal.     Nose: Congestion present.     Mouth/Throat:     Mouth: Mucous membranes are moist.     Pharynx: Uvula midline. Oropharyngeal exudate and posterior oropharyngeal erythema present.   Eyes:     Extraocular Movements: Extraocular movements intact.     Conjunctiva/sclera: Conjunctivae normal.     Pupils: Pupils are equal, round, and reactive to light.  Cardiovascular:     Rate and Rhythm: Normal rate and regular rhythm.     Pulses: Normal pulses.     Heart sounds: Normal heart sounds. No murmur heard. Pulmonary:     Effort: Pulmonary effort is normal. No respiratory distress.     Breath sounds: Normal breath sounds. No wheezing.  Musculoskeletal:     Cervical back: Normal range of motion.  Skin:    General: Skin is warm.  Neurological:     Mental Status: She is alert and oriented to person, place, and time.  Psychiatric:        Mood and Affect: Mood normal.        Assessment & Plan:  Acute streptococcal pharyngitis -     POCT rapid strep A -     POC COVID-19 BinaxNow -     Amoxicillin; Take 1  capsule (500 mg total) by mouth 2 (two) times daily for 10 days.  Dispense: 20 capsule; Refill: 0  COVID-19   POC COVID-19 and Strep A both positive. Treat strep with amoxicillin as directed. Conservative tx for COVID as she is not high-risk - fluids, rest, Tylenol / ibuprofen prn. Work note provided. ER precautions discussed. Pt agreeable with plan.     Return if symptoms worsen or fail to improve.    Boots Mcglown M Joshaua Epple, PA-C

## 2023-02-15 ENCOUNTER — Encounter: Payer: Self-pay | Admitting: Physician Assistant
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
## 2023-02-15 NOTE — Telephone Encounter (Signed)
Patient called to make sure message was received. Patient wanted to make sure return date would be 02/18/23.

## 2023-02-16 ENCOUNTER — Encounter (HOSPITAL_COMMUNITY): Payer: Self-pay | Admitting: Psychiatry

## 2023-02-16 ENCOUNTER — Telehealth (HOSPITAL_BASED_OUTPATIENT_CLINIC_OR_DEPARTMENT_OTHER): Payer: 59 | Admitting: Psychiatry

## 2023-02-16 DIAGNOSIS — F32A Depression, unspecified: Secondary | ICD-10-CM

## 2023-02-16 DIAGNOSIS — F909 Attention-deficit hyperactivity disorder, unspecified type: Secondary | ICD-10-CM

## 2023-02-16 DIAGNOSIS — F419 Anxiety disorder, unspecified: Secondary | ICD-10-CM | POA: Diagnosis not present

## 2023-02-16 MED ORDER — METHYLPHENIDATE HCL ER (OSM) 36 MG PO TBCR: 36.0000 mg | EXTENDED_RELEASE_TABLET | Freq: Every day | ORAL | 0 refills | Status: DC

## 2023-02-16 MED ORDER — FLUOXETINE HCL 40 MG PO CAPS
40.0000 mg | ORAL_CAPSULE | Freq: Every day | ORAL | 0 refills | Status: DC
Start: 2023-02-16 — End: 2023-04-22

## 2023-02-16 MED ORDER — LAMOTRIGINE 100 MG PO TABS
100.0000 mg | ORAL_TABLET | Freq: Two times a day (BID) | ORAL | 0 refills | Status: DC
Start: 2023-02-16 — End: 2023-04-22

## 2023-02-16 NOTE — Telephone Encounter (Signed)
Patient states she needs letter updated today. Could you complete this and send via mychart please?

## 2023-02-17 ENCOUNTER — Encounter: Payer: Self-pay | Admitting: Physician Assistant

## 2023-02-26 ENCOUNTER — Ambulatory Visit: Payer: 59 | Admitting: Physician Assistant
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
## 2023-04-22 ENCOUNTER — Encounter (HOSPITAL_COMMUNITY): Payer: Self-pay | Admitting: Psychiatry

## 2023-04-22 ENCOUNTER — Telehealth (HOSPITAL_COMMUNITY): Payer: 59 | Admitting: Psychiatry

## 2023-04-22 DIAGNOSIS — F32A Depression, unspecified: Secondary | ICD-10-CM | POA: Diagnosis not present

## 2023-04-22 DIAGNOSIS — F419 Anxiety disorder, unspecified: Secondary | ICD-10-CM

## 2023-04-22 DIAGNOSIS — F909 Attention-deficit hyperactivity disorder, unspecified type: Secondary | ICD-10-CM

## 2023-04-22 MED ORDER — FLUOXETINE HCL 40 MG PO CAPS
40.0000 mg | ORAL_CAPSULE | Freq: Every day | ORAL | 0 refills | Status: DC
Start: 2023-04-22 — End: 2023-07-20

## 2023-04-22 MED ORDER — METHYLPHENIDATE HCL ER (OSM) 54 MG PO TBCR
54.0000 mg | EXTENDED_RELEASE_TABLET | Freq: Every day | ORAL | 0 refills | Status: DC
Start: 2023-05-21 — End: 2023-07-21

## 2023-04-22 MED ORDER — LAMOTRIGINE 100 MG PO TABS
100.0000 mg | ORAL_TABLET | Freq: Two times a day (BID) | ORAL | 0 refills | Status: DC
Start: 2023-04-22 — End: 2023-07-21

## 2023-04-22 MED ORDER — METHYLPHENIDATE HCL ER (OSM) 54 MG PO TBCR
54.0000 mg | EXTENDED_RELEASE_TABLET | Freq: Every day | ORAL | 0 refills | Status: DC
Start: 2023-04-22 — End: 2023-06-01
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
## 2023-06-01 ENCOUNTER — Telehealth (HOSPITAL_BASED_OUTPATIENT_CLINIC_OR_DEPARTMENT_OTHER): Payer: 59 | Admitting: Psychiatry

## 2023-06-01 ENCOUNTER — Encounter (HOSPITAL_COMMUNITY): Payer: Self-pay | Admitting: Psychiatry

## 2023-06-01 DIAGNOSIS — F32A Depression, unspecified: Secondary | ICD-10-CM

## 2023-06-01 DIAGNOSIS — F419 Anxiety disorder, unspecified: Secondary | ICD-10-CM

## 2023-06-01 DIAGNOSIS — F909 Attention-deficit hyperactivity disorder, unspecified type: Secondary | ICD-10-CM

## 2023-06-01 MED ORDER — METHYLPHENIDATE HCL ER (OSM) 54 MG PO TBCR
54.0000 mg | EXTENDED_RELEASE_TABLET | Freq: Every day | ORAL | 0 refills | Status: DC
Start: 2023-06-18 — End: 2023-07-21
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
## 2023-07-20 ENCOUNTER — Telehealth (HOSPITAL_COMMUNITY): Payer: 59 | Admitting: Psychiatry

## 2023-07-20 DIAGNOSIS — F32A Depression, unspecified: Secondary | ICD-10-CM | POA: Diagnosis not present

## 2023-07-20 DIAGNOSIS — F909 Attention-deficit hyperactivity disorder, unspecified type: Secondary | ICD-10-CM

## 2023-07-20 DIAGNOSIS — F419 Anxiety disorder, unspecified: Secondary | ICD-10-CM | POA: Diagnosis not present

## 2023-07-20 MED ORDER — FLUOXETINE HCL 40 MG PO CAPS: 40.0000 mg | ORAL_CAPSULE | Freq: Every day | ORAL | 0 refills | Status: DC

## 2023-07-20 MED ORDER — FLUOXETINE HCL 20 MG PO CAPS: 20.0000 mg | ORAL_CAPSULE | Freq: Every day | ORAL | 2 refills | Status: DC

## 2023-07-20 MED ORDER — PROPRANOLOL HCL 10 MG PO TABS: 10.0000 mg | ORAL_TABLET | Freq: Two times a day (BID) | ORAL | 2 refills | Status: DC | PRN

## 2023-07-21 ENCOUNTER — Encounter (HOSPITAL_COMMUNITY): Payer: Self-pay

## 2023-07-21 ENCOUNTER — Encounter (HOSPITAL_COMMUNITY): Payer: Self-pay | Admitting: Psychiatry

## 2023-07-21 MED ORDER — METHYLPHENIDATE HCL ER (OSM) 54 MG PO TBCR: 54.0000 mg | EXTENDED_RELEASE_TABLET | Freq: Every day | ORAL | 0 refills | Status: DC

## 2023-07-21 MED ORDER — LAMOTRIGINE 100 MG PO TABS: 100.0000 mg | ORAL_TABLET | Freq: Two times a day (BID) | ORAL | 0 refills | Status: DC

## 2023-07-21 NOTE — Addendum Note (Signed)
 Addended by: Everlena Cooper on: 07/21/2023 12:01 PM   Modules accepted: Orders

## 2023-08-11 ENCOUNTER — Other Ambulatory Visit (HOSPITAL_COMMUNITY): Payer: Self-pay | Admitting: Psychiatry

## 2023-08-11 DIAGNOSIS — F32A Depression, unspecified: Secondary | ICD-10-CM

## 2023-08-16 ENCOUNTER — Other Ambulatory Visit: Payer: Self-pay | Admitting: Physician Assistant

## 2023-08-16 ENCOUNTER — Ambulatory Visit (INDEPENDENT_AMBULATORY_CARE_PROVIDER_SITE_OTHER): Payer: 59 | Admitting: Physician Assistant

## 2023-08-16 ENCOUNTER — Encounter: Payer: Self-pay | Admitting: Physician Assistant

## 2023-08-16 VITALS — BP 108/72 | HR 73 | Temp 97.9°F | Ht 65.0 in | Wt 302.8 lb

## 2023-08-16 DIAGNOSIS — J4520 Mild intermittent asthma, uncomplicated: Secondary | ICD-10-CM

## 2023-08-16 DIAGNOSIS — Z13220 Encounter for screening for lipoid disorders: Secondary | ICD-10-CM

## 2023-08-16 DIAGNOSIS — K130 Diseases of lips: Secondary | ICD-10-CM | POA: Diagnosis not present

## 2023-08-16 DIAGNOSIS — Z23 Encounter for immunization: Secondary | ICD-10-CM | POA: Diagnosis not present

## 2023-08-16 DIAGNOSIS — R5383 Other fatigue: Secondary | ICD-10-CM | POA: Diagnosis not present

## 2023-08-16 DIAGNOSIS — E559 Vitamin D deficiency, unspecified: Secondary | ICD-10-CM | POA: Diagnosis not present

## 2023-08-16 DIAGNOSIS — R600 Localized edema: Secondary | ICD-10-CM

## 2023-08-16 LAB — COMPREHENSIVE METABOLIC PANEL
ALT: 16 U/L (ref 0–35)
AST: 17 U/L (ref 0–37)
Albumin: 4.4 g/dL (ref 3.5–5.2)
Alkaline Phosphatase: 63 U/L (ref 39–117)
BUN: 23 mg/dL (ref 6–23)
CO2: 24 meq/L (ref 19–32)
Calcium: 9.1 mg/dL (ref 8.4–10.5)
Chloride: 105 meq/L (ref 96–112)
Creatinine, Ser: 0.86 mg/dL (ref 0.40–1.20)
GFR: 93.97 mL/min (ref >60.00)
Glucose, Bld: 87 mg/dL (ref 70–99)
Potassium: 5 meq/L (ref 3.5–5.1)
Sodium: 137 meq/L (ref 135–145)
Total Bilirubin: 0.7 mg/dL (ref 0.2–1.2)
Total Protein: 6.8 g/dL (ref 6.0–8.3)

## 2023-08-16 LAB — CBC WITH DIFFERENTIAL/PLATELET
Basophils Absolute: 0.1 10*3/uL (ref 0.0–0.1)
Basophils Relative: 0.6 % (ref 0.0–3.0)
Eosinophils Absolute: 0.1 10*3/uL (ref 0.0–0.7)
Eosinophils Relative: 1.4 % (ref 0.0–5.0)
HCT: 39 % (ref 36.0–46.0)
Hemoglobin: 12.8 g/dL (ref 12.0–15.0)
Lymphocytes Relative: 19.7 % (ref 12.0–46.0)
Lymphs Abs: 2 10*3/uL (ref 0.7–4.0)
MCHC: 33 g/dL (ref 30.0–36.0)
MCV: 83.2 fL (ref 78.0–100.0)
Monocytes Absolute: 0.6 10*3/uL (ref 0.1–1.0)
Monocytes Relative: 5.7 % (ref 3.0–12.0)
Neutro Abs: 7.2 10*3/uL (ref 1.4–7.7)
Neutrophils Relative %: 72.6 % (ref 43.0–77.0)
Platelets: 346 10*3/uL (ref 150.0–400.0)
RBC: 4.68 Mil/uL (ref 3.87–5.11)
RDW: 15.1 % (ref 11.5–15.5)
WBC: 10 10*3/uL (ref 4.0–10.5)

## 2023-08-16 LAB — VITAMIN B12: Vitamin B-12: 252 pg/mL (ref 211–911)

## 2023-08-16 LAB — LIPID PANEL
Cholesterol: 174 mg/dL (ref 0–200)
HDL: 46.4 mg/dL (ref >39.00)
LDL Cholesterol: 117 mg/dL — ABNORMAL HIGH (ref 0–99)
NonHDL: 127.93
Total CHOL/HDL Ratio: 4
Triglycerides: 53 mg/dL (ref 0.0–149.0)
VLDL: 10.6 mg/dL (ref 0.0–40.0)

## 2023-08-16 LAB — VITAMIN D 25 HYDROXY (VIT D DEFICIENCY, FRACTURES): VITD: 16.86 ng/mL — ABNORMAL LOW (ref 30.00–100.00)

## 2023-08-16 LAB — TSH: TSH: 2.55 u[IU]/mL (ref 0.35–5.50)

## 2023-08-16 LAB — HEMOGLOBIN A1C: Hgb A1c MFr Bld: 5.5 % (ref 4.6–6.5)

## 2023-08-16 MED ORDER — TACROLIMUS 0.1 % EX OINT: TOPICAL_OINTMENT | Freq: Two times a day (BID) | CUTANEOUS | 0 refills | Status: DC

## 2023-08-16 MED ORDER — ASMANEX HFA 100 MCG/ACT IN AERO: 200.0000 ug | INHALATION_SPRAY | Freq: Two times a day (BID) | RESPIRATORY_TRACT | 5 refills | Status: AC

## 2023-08-16 MED ORDER — QVAR REDIHALER 40 MCG/ACT IN AERB: 2.0000 | INHALATION_SPRAY | Freq: Two times a day (BID) | RESPIRATORY_TRACT | 2 refills | Status: DC

## 2023-08-16 NOTE — Progress Notes (Signed)
Patient ID: Ngan Qualls, female    DOB: Feb 09, 1996, 28 y.o.   MRN: [id number]   Assessment & Plan:  Other fatigue -     VITAMIN D 25 Hydroxy (Vit-D Deficiency, Fractures) -     Vitamin B12 -     TSH -     Comprehensive metabolic panel -     CBC with Differential/Platelet -     Hemoglobin A1c  Lower leg edema  Angular cheilitis -     Tacrolimus; Apply topically 2 (two) times daily.  Dispense: 60 g; Refill: 0  Mild intermittent asthma without complication -     Qvar RediHaler; Inhale 2 puffs into the lungs 2 (two) times daily.  Dispense: 1 each; Refill: 2  Immunization due -     Flu vaccine trivalent PF, 6mos and older(Flulaval,Afluria,Fluarix,Fluzone)  Need for pneumococcal vaccine -     Pneumococcal conjugate vaccine 20-valent  Vitamin D deficiency -     VITAMIN D 25 Hydroxy (Vit-D Deficiency, Fractures)  Screening for cholesterol level -     Lipid panel   Assessment and Plan    Lower Extremity Edema Chronic left lower extremity edema, worse with prolonged standing. No pain or redness. Wearing compression socks. -Continue compression socks. -Limit salt intake. -Check electrolytes to rule out any abnormalities. -Elevate legs above heart level when resting.  Angular Cheilitis Chronic, recurrent angular cheilitis. Previous use of steroid cream with initial improvement, but less effective recently. -Discontinue steroid cream. -Use Vaseline for moisture protection. -Avoid chapstick or lipstick. -Trial of tacrolimus cream, pending insurance approval.  Sexual Dysfunction Difficulty with vaginal lubrication, possibly related to SSRI use (fluoxetine). No change in ability to orgasm. -Discuss with gynecologist. -Consider role of current progesterone-only birth control. -Encourage use of lubrication and foreplay.  Fatigue Chronic fatigue, possibly worsened by recent change in work schedule. -Check thyroid function tests, given previous thyroid issues.  General  Health Maintenance -Administer flu vaccine today. -Complete blood work today.         Subjective:    Chief Complaint  Patient presents with   Annual Exam    Pt in office for annual CPE; pt is fasting for labs; pt wants to discuss corners of mouth splitting open for the past of year; heals and fine for 2 weeks and then opens up again. 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 Also LLE swelling, pt wearing compression socks during the day while working, pt is on her feet a lot during work. Pt expresses has had the issue with edema for several years;     HPI  History of Present Illness   Glennys, a patient with a history of mood disorders and asthma, presents with multiple concerns. The chief complaint is ongoing fatigue, which she attributes to a recent change in her work schedule. She now works from 5 PM to 1:30 AM five days a week, which has disrupted her sleep pattern. She also mentions that her job requires her to be on her feet for extended periods, which has led to leg pain and swelling, particularly in her left leg. The swelling is noticeable even when she is wearing compression socks and tends to improve after a few hours of rest.  In addition to these issues, Melayna has been experiencing recurrent cuts on her lips for about a year. The cuts heal with the use of Vaseline or Chapstick, but they tend to recur. She was previously prescribed a  steroid cream, which initially helped but is now less effective.  Dezeray also reports sexual dysfunction, specifically difficulty with vaginal lubrication, which she believes may be related to her medications. She feels aroused but does not experience the physical response of lubrication, which is causing distress for her and her partner. She is currently on Prozac (fluoxetine) and a progesterone-only birth control.       Past Medical History:  Diagnosis Date   ADHD    Anemia    Anxiety    Asthma     Depression     Past Surgical History:  Procedure Laterality Date   TONSILLECTOMY     WISDOM TOOTH EXTRACTION      Family History  Problem Relation Age of Onset   Asthma Mother    Alcohol abuse Mother    Drug abuse Father     Social History   Tobacco Use   Smoking status: Never   Smokeless tobacco: Never  Vaping Use   Vaping status: Never Used  Substance Use Topics   Alcohol use: Yes    Comment: occasionally   Drug use: Never     Allergies  Allergen Reactions   Hydrocodone Nausea And Vomiting    Pill form made pt vomit and nausea, not issue with liquid form    Review of Systems NEGATIVE UNLESS OTHERWISE INDICATED IN HPI      Objective:     BP 108/72 (BP Location: Left Arm, Patient Position: Sitting)   Pulse 73   Temp 97.9 F (36.6 C) (Temporal)   Ht 5\' 5"  (1.651 m)   Wt (!) 302 lb 12.8 oz (137.3 kg)   SpO2 98%   BMI 50.39 kg/m   Wt Readings from Last 3 Encounters:  08/16/23 (!) 302 lb 12.8 oz (137.3 kg)  02/12/23 291 lb (132 kg)  11/26/22 294 lb 6.4 oz (133.5 kg)    BP Readings from Last 3 Encounters:  08/16/23 108/72  02/12/23 93/61  11/26/22 122/82     Physical Exam Vitals and nursing note reviewed.  Constitutional:      Appearance: Normal appearance. She is obese. She is not toxic-appearing.  HENT:     Head: Normocephalic and atraumatic.     Right Ear: External ear normal.     Left Ear: External ear normal.     Nose: Nose normal.     Mouth/Throat:     Mouth: Mucous membranes are moist.  Eyes:     Extraocular Movements: Extraocular movements intact.     Conjunctiva/sclera: Conjunctivae normal.     Pupils: Pupils are equal, round, and reactive to light.  Cardiovascular:     Rate and Rhythm: Normal rate and regular rhythm.     Pulses: Normal pulses.     Heart sounds: Normal heart sounds.  Pulmonary:     Effort: Pulmonary effort is normal.     Breath sounds: Normal breath sounds.  Musculoskeletal:        General: Normal range of  motion.     Cervical back: Normal range of motion and neck supple.     Right lower leg: Edema present.     Left lower leg: Edema present.     Comments: Bilateral non-pitting edema lower legs, equal on both sides  Skin:    General: Skin is warm and dry.     Findings: Erythema and rash (angular chelitis present) present.  Neurological:     General: No focal deficit present.     Mental Status: She is alert  and oriented to person, place, and time.  Psychiatric:        Mood and Affect: Mood normal.        Behavior: Behavior normal.        Fruma [place] M Karla Pavone, PA-C

## 2023-08-17 ENCOUNTER — Encounter: Payer: Self-pay | Admitting: Physician Assistant

## 2023-08-17 ENCOUNTER — Other Ambulatory Visit: Payer: Self-pay

## 2023-08-17 MED ORDER — VITAMIN D (ERGOCALCIFEROL) 1.25 MG (50000 UNIT) PO CAPS: 50000.0000 [IU] | ORAL_CAPSULE | ORAL | 0 refills | Status: AC

## 2023-08-19 ENCOUNTER — Telehealth: Payer: Self-pay

## 2023-08-19 ENCOUNTER — Other Ambulatory Visit (HOSPITAL_COMMUNITY): Payer: Self-pay

## 2023-08-19 NOTE — Telephone Encounter (Signed)
 Pharmacy Patient Advocate Encounter   Received notification from  [facility] that prior authorization for Tacrolimus  0.1% ointment is required/requested.   Insurance verification completed.   The patient is insured through [employer] [facility] .   Per test claim: PA required; PA submitted to above mentioned insurance via CoverMyMeds Key/confirmation #/EOC [id number] Status is pending

## 2023-08-19 NOTE — Telephone Encounter (Signed)
 PA for patient denied

## 2023-08-19 NOTE — Telephone Encounter (Signed)
 Pharmacy Patient Advocate Encounter  Received notification from [employer] [facility] that Prior Authorization for Tacrolimus  0.1% ointment  has been DENIED.  Full denial letter will be uploaded to the media tab. See denial reason below.     PA #/Case ID/Reference #: [id number]

## 2023-08-23 ENCOUNTER — Other Ambulatory Visit (HOSPITAL_COMMUNITY): Payer: Self-pay | Admitting: Psychiatry

## 2023-08-23 DIAGNOSIS — F909 Attention-deficit hyperactivity disorder, unspecified type: Secondary | ICD-10-CM

## 2023-08-23 MED ORDER — METHYLPHENIDATE HCL ER (OSM) 54 MG PO TBCR: 54.0000 mg | EXTENDED_RELEASE_TABLET | Freq: Every day | ORAL | 0 refills | Status: DC
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
## 2023-08-27 ENCOUNTER — Telehealth (HOSPITAL_COMMUNITY): Payer: 59 | Admitting: Psychiatry

## 2023-08-27 ENCOUNTER — Encounter (HOSPITAL_COMMUNITY): Payer: Self-pay | Admitting: Psychiatry

## 2023-08-27 DIAGNOSIS — F32A Depression, unspecified: Secondary | ICD-10-CM | POA: Diagnosis not present

## 2023-08-27 DIAGNOSIS — F909 Attention-deficit hyperactivity disorder, unspecified type: Secondary | ICD-10-CM

## 2023-08-27 DIAGNOSIS — F419 Anxiety disorder, unspecified: Secondary | ICD-10-CM

## 2023-08-27 MED ORDER — METHYLPHENIDATE HCL ER (OSM) 54 MG PO TBCR: 54.0000 mg | EXTENDED_RELEASE_TABLET | Freq: Every day | ORAL | 0 refills | Status: DC

## 2023-09-19 ENCOUNTER — Other Ambulatory Visit: Payer: Self-pay | Admitting: Physician Assistant

## 2023-09-19 DIAGNOSIS — K130 Diseases of lips: Secondary | ICD-10-CM

## 2023-09-25 ENCOUNTER — Other Ambulatory Visit (HOSPITAL_COMMUNITY): Payer: Self-pay | Admitting: Psychiatry

## 2023-09-25 DIAGNOSIS — F32A Depression, unspecified: Secondary | ICD-10-CM
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
## 2023-09-30 ENCOUNTER — Encounter (HOSPITAL_COMMUNITY): Payer: Self-pay | Admitting: Psychiatry

## 2023-09-30 ENCOUNTER — Telehealth (HOSPITAL_COMMUNITY): Payer: 59 | Admitting: Psychiatry

## 2023-09-30 DIAGNOSIS — F419 Anxiety disorder, unspecified: Secondary | ICD-10-CM | POA: Diagnosis not present

## 2023-09-30 DIAGNOSIS — F909 Attention-deficit hyperactivity disorder, unspecified type: Secondary | ICD-10-CM

## 2023-09-30 DIAGNOSIS — F32A Depression, unspecified: Secondary | ICD-10-CM

## 2023-09-30 MED ORDER — FLUOXETINE HCL 40 MG PO CAPS: 80.0000 mg | ORAL_CAPSULE | Freq: Every day | ORAL | 0 refills | Status: DC

## 2023-09-30 MED ORDER — METHYLPHENIDATE HCL ER (OSM) 54 MG PO TBCR: 54.0000 mg | EXTENDED_RELEASE_TABLET | Freq: Every day | ORAL | 0 refills | Status: DC

## 2023-09-30 MED ORDER — PROPRANOLOL HCL 10 MG PO TABS: 10.0000 mg | ORAL_TABLET | Freq: Two times a day (BID) | ORAL | 2 refills | Status: AC | PRN

## 2023-09-30 MED ORDER — LAMOTRIGINE 100 MG PO TABS: 100.0000 mg | ORAL_TABLET | Freq: Two times a day (BID) | ORAL | 0 refills | Status: DC

## 2023-10-31 ENCOUNTER — Other Ambulatory Visit (HOSPITAL_COMMUNITY): Payer: Self-pay | Admitting: Psychiatry

## 2023-10-31 DIAGNOSIS — F419 Anxiety disorder, unspecified: Secondary | ICD-10-CM
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
## 2023-11-18 ENCOUNTER — Encounter (HOSPITAL_COMMUNITY): Payer: Self-pay | Admitting: Psychiatry

## 2023-11-18 ENCOUNTER — Telehealth (HOSPITAL_COMMUNITY): Admitting: Psychiatry

## 2023-11-18 ENCOUNTER — Encounter (HOSPITAL_COMMUNITY): Payer: Self-pay

## 2023-11-18 DIAGNOSIS — F909 Attention-deficit hyperactivity disorder, unspecified type: Secondary | ICD-10-CM

## 2023-11-18 DIAGNOSIS — F419 Anxiety disorder, unspecified: Secondary | ICD-10-CM | POA: Diagnosis not present

## 2023-11-18 DIAGNOSIS — F32A Depression, unspecified: Secondary | ICD-10-CM

## 2023-11-18 MED ORDER — METHYLPHENIDATE HCL ER (LA) 30 MG PO CP24
30.0000 mg | ORAL_CAPSULE | ORAL | 0 refills | Status: DC
Start: 2023-12-17 — End: 2023-12-31

## 2023-11-18 MED ORDER — METHYLPHENIDATE HCL ER (LA) 30 MG PO CP24: 30.0000 mg | ORAL_CAPSULE | ORAL | 0 refills | Status: DC
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
## 2023-12-28 ENCOUNTER — Encounter (HOSPITAL_COMMUNITY): Admitting: Psychiatry

## 2023-12-28 NOTE — Progress Notes (Signed)
 This encounter was created in error - please disregard.  Appt rescheduled by provider.

## 2023-12-30 ENCOUNTER — Other Ambulatory Visit (HOSPITAL_COMMUNITY): Payer: Self-pay | Admitting: Psychiatry

## 2023-12-30 DIAGNOSIS — F419 Anxiety disorder, unspecified: Secondary | ICD-10-CM

## 2023-12-30 DIAGNOSIS — F32A Depression, unspecified: Secondary | ICD-10-CM

## 2023-12-30 MED ORDER — FLUOXETINE HCL 40 MG PO CAPS: 80.0000 mg | ORAL_CAPSULE | Freq: Every day | ORAL | 0 refills | Status: DC
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
## 2023-12-31 ENCOUNTER — Encounter (HOSPITAL_COMMUNITY): Payer: Self-pay | Admitting: Psychiatry

## 2023-12-31 ENCOUNTER — Telehealth (HOSPITAL_BASED_OUTPATIENT_CLINIC_OR_DEPARTMENT_OTHER): Admitting: Psychiatry

## 2023-12-31 DIAGNOSIS — F32A Depression, unspecified: Secondary | ICD-10-CM

## 2023-12-31 DIAGNOSIS — F419 Anxiety disorder, unspecified: Secondary | ICD-10-CM | POA: Diagnosis not present

## 2023-12-31 DIAGNOSIS — F909 Attention-deficit hyperactivity disorder, unspecified type: Secondary | ICD-10-CM | POA: Diagnosis not present

## 2023-12-31 MED ORDER — LAMOTRIGINE 100 MG PO TABS: 100.0000 mg | ORAL_TABLET | Freq: Every day | ORAL | 1 refills | Status: DC

## 2023-12-31 MED ORDER — METHYLPHENIDATE HCL ER (LA) 30 MG PO CP24: 30.0000 mg | ORAL_CAPSULE | ORAL | 0 refills | Status: DC

## 2023-12-31 MED ORDER — LAMOTRIGINE 25 MG PO TABS: ORAL_TABLET | ORAL | 0 refills | Status: DC

## 2024-01-23 ENCOUNTER — Other Ambulatory Visit (HOSPITAL_COMMUNITY): Payer: Self-pay | Admitting: Psychiatry

## 2024-01-23 DIAGNOSIS — F419 Anxiety disorder, unspecified: Secondary | ICD-10-CM

## 2024-02-17 ENCOUNTER — Encounter (HOSPITAL_COMMUNITY): Payer: Self-pay
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
## 2024-02-22 ENCOUNTER — Encounter (HOSPITAL_COMMUNITY): Admitting: Psychiatry

## 2024-02-22 ENCOUNTER — Encounter (HOSPITAL_COMMUNITY): Payer: Self-pay

## 2024-02-22 NOTE — Progress Notes (Signed)
 This encounter was created in error - please disregard.  Patient did not show up for the appointment.  Appointment reminders were sent to patient's phone and email.  Attempted to call the patient at 3:35 but received no response. Left a HIPAA compliant voicemail for patient to reschedule.
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
## 2024-03-21 ENCOUNTER — Telehealth (HOSPITAL_BASED_OUTPATIENT_CLINIC_OR_DEPARTMENT_OTHER): Admitting: Psychiatry

## 2024-03-21 ENCOUNTER — Encounter (HOSPITAL_COMMUNITY): Payer: Self-pay | Admitting: Psychiatry

## 2024-03-21 DIAGNOSIS — F419 Anxiety disorder, unspecified: Secondary | ICD-10-CM

## 2024-03-21 DIAGNOSIS — F909 Attention-deficit hyperactivity disorder, unspecified type: Secondary | ICD-10-CM

## 2024-03-21 DIAGNOSIS — Z79899 Other long term (current) drug therapy: Secondary | ICD-10-CM

## 2024-03-21 DIAGNOSIS — F32A Depression, unspecified: Secondary | ICD-10-CM | POA: Diagnosis not present

## 2024-03-21 MED ORDER — LAMOTRIGINE 100 MG PO TABS: 100.0000 mg | ORAL_TABLET | Freq: Every day | ORAL | 0 refills | Status: DC

## 2024-03-21 MED ORDER — METHYLPHENIDATE HCL ER (LA) 40 MG PO CP24: 40.0000 mg | ORAL_CAPSULE | ORAL | 0 refills | Status: DC

## 2024-03-21 MED ORDER — FLUOXETINE HCL 40 MG PO CAPS: 80.0000 mg | ORAL_CAPSULE | Freq: Every day | ORAL | 0 refills | Status: DC

## 2024-03-22 ENCOUNTER — Encounter (HOSPITAL_COMMUNITY): Payer: Self-pay

## 2024-03-26 ENCOUNTER — Other Ambulatory Visit (HOSPITAL_COMMUNITY): Payer: Self-pay | Admitting: Psychiatry

## 2024-03-26 DIAGNOSIS — F419 Anxiety disorder, unspecified: Secondary | ICD-10-CM

## 2024-03-28 ENCOUNTER — Ambulatory Visit (HOSPITAL_COMMUNITY)

## 2024-04-19 ENCOUNTER — Ambulatory Visit (HOSPITAL_BASED_OUTPATIENT_CLINIC_OR_DEPARTMENT_OTHER)

## 2024-04-19 DIAGNOSIS — Z79899 Other long term (current) drug therapy: Secondary | ICD-10-CM

## 2024-04-19 NOTE — Progress Notes (Signed)
 Patient arrived today for her due blood work, I drew a Loss adjuster, chartered top using a 23 G butterfly, patient tolerated well and without complaint.

## 2024-04-20 ENCOUNTER — Telehealth (HOSPITAL_COMMUNITY): Payer: Self-pay

## 2024-04-20 LAB — VITAMIN D 25 HYDROXY (VIT D DEFICIENCY, FRACTURES): Vit D, 25-Hydroxy: 22.4 ng/mL — ABNORMAL LOW (ref 30.0–100.0)

## 2024-04-20 NOTE — Telephone Encounter (Signed)
 Good morning, Patients labs came in and her vitamin D  is 22.4. Please review and advise, thank you
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
## 2024-06-01 ENCOUNTER — Telehealth (HOSPITAL_BASED_OUTPATIENT_CLINIC_OR_DEPARTMENT_OTHER): Admitting: Psychiatry

## 2024-06-01 ENCOUNTER — Encounter (HOSPITAL_COMMUNITY): Payer: Self-pay | Admitting: Psychiatry

## 2024-06-01 DIAGNOSIS — F32A Depression, unspecified: Secondary | ICD-10-CM | POA: Diagnosis not present

## 2024-06-01 DIAGNOSIS — F909 Attention-deficit hyperactivity disorder, unspecified type: Secondary | ICD-10-CM

## 2024-06-01 DIAGNOSIS — F419 Anxiety disorder, unspecified: Secondary | ICD-10-CM | POA: Diagnosis not present

## 2024-06-01 MED ORDER — METHYLPHENIDATE HCL ER (LA) 40 MG PO CP24: 40.0000 mg | ORAL_CAPSULE | ORAL | 0 refills | Status: DC

## 2024-06-01 MED ORDER — FLUOXETINE HCL 40 MG PO CAPS: 80.0000 mg | ORAL_CAPSULE | Freq: Every day | ORAL | 0 refills | Status: DC

## 2024-06-01 MED ORDER — LAMOTRIGINE 100 MG PO TABS: 100.0000 mg | ORAL_TABLET | Freq: Every day | ORAL | 0 refills | Status: DC

## 2024-06-01 MED ORDER — METHYLPHENIDATE HCL ER (LA) 40 MG PO CP24: 40.0000 mg | ORAL_CAPSULE | ORAL | 0 refills | Status: AC
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
## 2024-08-17 ENCOUNTER — Encounter (HOSPITAL_COMMUNITY): Payer: Self-pay | Admitting: Psychiatry

## 2024-08-17 ENCOUNTER — Telehealth (HOSPITAL_COMMUNITY): Admitting: Psychiatry

## 2024-08-17 DIAGNOSIS — F909 Attention-deficit hyperactivity disorder, unspecified type: Secondary | ICD-10-CM

## 2024-08-17 DIAGNOSIS — F411 Generalized anxiety disorder: Secondary | ICD-10-CM

## 2024-08-17 DIAGNOSIS — F3340 Major depressive disorder, recurrent, in remission, unspecified: Secondary | ICD-10-CM

## 2024-08-17 MED ORDER — FLUOXETINE HCL 40 MG PO CAPS: 80.0000 mg | ORAL_CAPSULE | Freq: Every day | ORAL | 0 refills | Status: AC

## 2024-08-17 MED ORDER — METHYLPHENIDATE HCL ER (LA) 40 MG PO CP24: 40.0000 mg | ORAL_CAPSULE | ORAL | 0 refills | Status: AC

## 2024-08-17 MED ORDER — LAMOTRIGINE 100 MG PO TABS: 100.0000 mg | ORAL_TABLET | Freq: Every day | ORAL | 0 refills | Status: AC

## 2024-10-19 ENCOUNTER — Telehealth (HOSPITAL_COMMUNITY): Admitting: Psychiatry
